# Patient Record
Sex: Female | Born: 1935 | Race: White | Hispanic: No | Marital: Married | Smoking: Never smoker
Health system: Southern US, Community
[De-identification: ages and names within clinical notes are randomized; demographics above are authoritative.]

## PROBLEM LIST (undated history)

## (undated) DIAGNOSIS — E039 Hypothyroidism, unspecified: Secondary | ICD-10-CM

## (undated) DIAGNOSIS — F419 Anxiety disorder, unspecified: Secondary | ICD-10-CM

## (undated) DIAGNOSIS — E782 Mixed hyperlipidemia: Secondary | ICD-10-CM

## (undated) DIAGNOSIS — I10 Essential (primary) hypertension: Secondary | ICD-10-CM

## (undated) DIAGNOSIS — I639 Cerebral infarction, unspecified: Secondary | ICD-10-CM

## (undated) DIAGNOSIS — E119 Type 2 diabetes mellitus without complications: Secondary | ICD-10-CM

## (undated) HISTORY — DX: Mixed hyperlipidemia: E78.2

## (undated) HISTORY — DX: Type 2 diabetes mellitus without complications: E11.9

## (undated) HISTORY — PX: REPLACEMENT TOTAL KNEE: SUR1224

## (undated) HISTORY — DX: Cerebral infarction, unspecified: I63.9

## (undated) HISTORY — DX: Hypothyroidism, unspecified: E03.9

## (undated) HISTORY — DX: Anxiety disorder, unspecified: F41.9

## (undated) HISTORY — DX: Essential (primary) hypertension: I10

---

## 2015-08-09 DIAGNOSIS — I639 Cerebral infarction, unspecified: Secondary | ICD-10-CM | POA: Diagnosis not present

## 2015-08-09 DIAGNOSIS — E785 Hyperlipidemia, unspecified: Secondary | ICD-10-CM | POA: Diagnosis not present

## 2015-08-09 DIAGNOSIS — I10 Essential (primary) hypertension: Secondary | ICD-10-CM | POA: Diagnosis not present

## 2015-08-09 DIAGNOSIS — E119 Type 2 diabetes mellitus without complications: Secondary | ICD-10-CM | POA: Diagnosis not present

## 2015-08-09 DIAGNOSIS — Z79899 Other long term (current) drug therapy: Secondary | ICD-10-CM | POA: Diagnosis not present

## 2015-08-09 DIAGNOSIS — E039 Hypothyroidism, unspecified: Secondary | ICD-10-CM | POA: Diagnosis not present

## 2015-08-09 DIAGNOSIS — R5383 Other fatigue: Secondary | ICD-10-CM | POA: Diagnosis not present

## 2015-08-23 DIAGNOSIS — N390 Urinary tract infection, site not specified: Secondary | ICD-10-CM | POA: Diagnosis not present

## 2015-09-15 DIAGNOSIS — S92301A Fracture of unspecified metatarsal bone(s), right foot, initial encounter for closed fracture: Secondary | ICD-10-CM | POA: Diagnosis not present

## 2015-09-15 DIAGNOSIS — M7740 Metatarsalgia, unspecified foot: Secondary | ICD-10-CM | POA: Diagnosis not present

## 2015-09-29 DIAGNOSIS — I639 Cerebral infarction, unspecified: Secondary | ICD-10-CM | POA: Diagnosis not present

## 2015-10-02 DIAGNOSIS — M7740 Metatarsalgia, unspecified foot: Secondary | ICD-10-CM | POA: Diagnosis not present

## 2015-10-07 DIAGNOSIS — I639 Cerebral infarction, unspecified: Secondary | ICD-10-CM | POA: Diagnosis not present

## 2015-10-14 DIAGNOSIS — I639 Cerebral infarction, unspecified: Secondary | ICD-10-CM | POA: Diagnosis not present

## 2015-10-21 DIAGNOSIS — I639 Cerebral infarction, unspecified: Secondary | ICD-10-CM | POA: Diagnosis not present

## 2015-10-23 DIAGNOSIS — I638 Other cerebral infarction: Secondary | ICD-10-CM | POA: Diagnosis not present

## 2015-10-23 DIAGNOSIS — Z79899 Other long term (current) drug therapy: Secondary | ICD-10-CM | POA: Diagnosis not present

## 2015-11-07 DIAGNOSIS — I639 Cerebral infarction, unspecified: Secondary | ICD-10-CM | POA: Diagnosis not present

## 2015-11-09 DIAGNOSIS — Z471 Aftercare following joint replacement surgery: Secondary | ICD-10-CM | POA: Diagnosis not present

## 2015-11-09 DIAGNOSIS — T84092A Other mechanical complication of internal right knee prosthesis, initial encounter: Secondary | ICD-10-CM | POA: Diagnosis not present

## 2015-11-09 DIAGNOSIS — Z96651 Presence of right artificial knee joint: Secondary | ICD-10-CM | POA: Diagnosis not present

## 2015-11-11 DIAGNOSIS — I10 Essential (primary) hypertension: Secondary | ICD-10-CM | POA: Diagnosis not present

## 2015-11-11 DIAGNOSIS — E785 Hyperlipidemia, unspecified: Secondary | ICD-10-CM | POA: Diagnosis not present

## 2015-11-11 DIAGNOSIS — E119 Type 2 diabetes mellitus without complications: Secondary | ICD-10-CM | POA: Diagnosis not present

## 2015-11-11 DIAGNOSIS — I639 Cerebral infarction, unspecified: Secondary | ICD-10-CM | POA: Diagnosis not present

## 2015-11-11 DIAGNOSIS — E039 Hypothyroidism, unspecified: Secondary | ICD-10-CM | POA: Diagnosis not present

## 2015-11-20 DIAGNOSIS — E119 Type 2 diabetes mellitus without complications: Secondary | ICD-10-CM | POA: Diagnosis not present

## 2015-11-25 DIAGNOSIS — I639 Cerebral infarction, unspecified: Secondary | ICD-10-CM | POA: Diagnosis not present

## 2015-11-27 DIAGNOSIS — R55 Syncope and collapse: Secondary | ICD-10-CM | POA: Diagnosis not present

## 2015-11-27 DIAGNOSIS — E119 Type 2 diabetes mellitus without complications: Secondary | ICD-10-CM | POA: Diagnosis not present

## 2015-11-27 DIAGNOSIS — K5732 Diverticulitis of large intestine without perforation or abscess without bleeding: Secondary | ICD-10-CM | POA: Diagnosis not present

## 2015-11-27 DIAGNOSIS — I10 Essential (primary) hypertension: Secondary | ICD-10-CM | POA: Diagnosis not present

## 2015-11-27 DIAGNOSIS — N390 Urinary tract infection, site not specified: Secondary | ICD-10-CM | POA: Diagnosis not present

## 2015-11-27 DIAGNOSIS — R112 Nausea with vomiting, unspecified: Secondary | ICD-10-CM | POA: Diagnosis not present

## 2015-11-27 DIAGNOSIS — R42 Dizziness and giddiness: Secondary | ICD-10-CM | POA: Diagnosis not present

## 2015-11-27 DIAGNOSIS — Z7984 Long term (current) use of oral hypoglycemic drugs: Secondary | ICD-10-CM | POA: Diagnosis not present

## 2015-11-29 DIAGNOSIS — N390 Urinary tract infection, site not specified: Secondary | ICD-10-CM | POA: Diagnosis not present

## 2015-11-29 DIAGNOSIS — K5732 Diverticulitis of large intestine without perforation or abscess without bleeding: Secondary | ICD-10-CM | POA: Diagnosis not present

## 2015-11-29 DIAGNOSIS — R6881 Early satiety: Secondary | ICD-10-CM | POA: Diagnosis not present

## 2015-11-29 DIAGNOSIS — R1033 Periumbilical pain: Secondary | ICD-10-CM | POA: Diagnosis not present

## 2015-11-29 DIAGNOSIS — R112 Nausea with vomiting, unspecified: Secondary | ICD-10-CM | POA: Diagnosis not present

## 2015-11-29 DIAGNOSIS — R1013 Epigastric pain: Secondary | ICD-10-CM | POA: Diagnosis not present

## 2015-11-29 DIAGNOSIS — E119 Type 2 diabetes mellitus without complications: Secondary | ICD-10-CM | POA: Diagnosis not present

## 2015-11-29 DIAGNOSIS — R109 Unspecified abdominal pain: Secondary | ICD-10-CM | POA: Diagnosis not present

## 2015-11-29 DIAGNOSIS — E782 Mixed hyperlipidemia: Secondary | ICD-10-CM | POA: Diagnosis not present

## 2015-12-19 DIAGNOSIS — K5792 Diverticulitis of intestine, part unspecified, without perforation or abscess without bleeding: Secondary | ICD-10-CM | POA: Diagnosis not present

## 2015-12-23 DIAGNOSIS — I639 Cerebral infarction, unspecified: Secondary | ICD-10-CM | POA: Diagnosis not present

## 2016-01-04 DIAGNOSIS — I639 Cerebral infarction, unspecified: Secondary | ICD-10-CM | POA: Diagnosis not present

## 2016-01-11 DIAGNOSIS — Z7901 Long term (current) use of anticoagulants: Secondary | ICD-10-CM | POA: Diagnosis not present

## 2016-01-18 DIAGNOSIS — I639 Cerebral infarction, unspecified: Secondary | ICD-10-CM | POA: Diagnosis not present

## 2016-01-23 DIAGNOSIS — K5792 Diverticulitis of intestine, part unspecified, without perforation or abscess without bleeding: Secondary | ICD-10-CM | POA: Diagnosis not present

## 2016-01-26 DIAGNOSIS — M79673 Pain in unspecified foot: Secondary | ICD-10-CM | POA: Diagnosis not present

## 2016-01-26 DIAGNOSIS — Z7901 Long term (current) use of anticoagulants: Secondary | ICD-10-CM | POA: Diagnosis not present

## 2016-01-26 DIAGNOSIS — B001 Herpesviral vesicular dermatitis: Secondary | ICD-10-CM | POA: Diagnosis not present

## 2016-02-06 DIAGNOSIS — I639 Cerebral infarction, unspecified: Secondary | ICD-10-CM | POA: Diagnosis not present

## 2016-02-28 DIAGNOSIS — Z7901 Long term (current) use of anticoagulants: Secondary | ICD-10-CM | POA: Diagnosis not present

## 2016-03-01 DIAGNOSIS — Z961 Presence of intraocular lens: Secondary | ICD-10-CM | POA: Diagnosis not present

## 2016-03-01 DIAGNOSIS — E119 Type 2 diabetes mellitus without complications: Secondary | ICD-10-CM | POA: Diagnosis not present

## 2016-03-01 DIAGNOSIS — H04123 Dry eye syndrome of bilateral lacrimal glands: Secondary | ICD-10-CM | POA: Diagnosis not present

## 2016-03-12 DIAGNOSIS — E785 Hyperlipidemia, unspecified: Secondary | ICD-10-CM | POA: Diagnosis not present

## 2016-03-12 DIAGNOSIS — E119 Type 2 diabetes mellitus without complications: Secondary | ICD-10-CM | POA: Diagnosis not present

## 2016-03-12 DIAGNOSIS — R5383 Other fatigue: Secondary | ICD-10-CM | POA: Diagnosis not present

## 2016-03-12 DIAGNOSIS — I10 Essential (primary) hypertension: Secondary | ICD-10-CM | POA: Diagnosis not present

## 2016-03-20 DIAGNOSIS — L600 Ingrowing nail: Secondary | ICD-10-CM | POA: Diagnosis not present

## 2016-03-20 DIAGNOSIS — E1151 Type 2 diabetes mellitus with diabetic peripheral angiopathy without gangrene: Secondary | ICD-10-CM | POA: Diagnosis not present

## 2016-03-22 DIAGNOSIS — E782 Mixed hyperlipidemia: Secondary | ICD-10-CM | POA: Diagnosis not present

## 2016-03-22 DIAGNOSIS — E785 Hyperlipidemia, unspecified: Secondary | ICD-10-CM | POA: Diagnosis not present

## 2016-03-22 DIAGNOSIS — E119 Type 2 diabetes mellitus without complications: Secondary | ICD-10-CM | POA: Diagnosis not present

## 2016-04-17 DIAGNOSIS — Z79899 Other long term (current) drug therapy: Secondary | ICD-10-CM | POA: Diagnosis not present

## 2016-04-17 DIAGNOSIS — I639 Cerebral infarction, unspecified: Secondary | ICD-10-CM | POA: Diagnosis not present

## 2016-04-22 DIAGNOSIS — M1991 Primary osteoarthritis, unspecified site: Secondary | ICD-10-CM | POA: Diagnosis not present

## 2016-04-22 DIAGNOSIS — E118 Type 2 diabetes mellitus with unspecified complications: Secondary | ICD-10-CM | POA: Diagnosis not present

## 2016-04-22 DIAGNOSIS — I2609 Other pulmonary embolism with acute cor pulmonale: Secondary | ICD-10-CM | POA: Diagnosis not present

## 2016-04-24 DIAGNOSIS — I6340 Cerebral infarction due to embolism of unspecified cerebral artery: Secondary | ICD-10-CM | POA: Diagnosis not present

## 2016-05-01 DIAGNOSIS — Z79899 Other long term (current) drug therapy: Secondary | ICD-10-CM | POA: Diagnosis not present

## 2016-05-01 DIAGNOSIS — I639 Cerebral infarction, unspecified: Secondary | ICD-10-CM | POA: Diagnosis not present

## 2016-05-01 DIAGNOSIS — F329 Major depressive disorder, single episode, unspecified: Secondary | ICD-10-CM | POA: Diagnosis not present

## 2016-05-01 DIAGNOSIS — F419 Anxiety disorder, unspecified: Secondary | ICD-10-CM | POA: Diagnosis not present

## 2016-05-15 DIAGNOSIS — I6340 Cerebral infarction due to embolism of unspecified cerebral artery: Secondary | ICD-10-CM | POA: Diagnosis not present

## 2016-05-21 DIAGNOSIS — F411 Generalized anxiety disorder: Secondary | ICD-10-CM | POA: Diagnosis not present

## 2016-05-21 DIAGNOSIS — F332 Major depressive disorder, recurrent severe without psychotic features: Secondary | ICD-10-CM | POA: Diagnosis not present

## 2016-05-28 DIAGNOSIS — Z7901 Long term (current) use of anticoagulants: Secondary | ICD-10-CM | POA: Diagnosis not present

## 2016-06-07 DIAGNOSIS — Z7901 Long term (current) use of anticoagulants: Secondary | ICD-10-CM | POA: Diagnosis not present

## 2016-06-12 DIAGNOSIS — I10 Essential (primary) hypertension: Secondary | ICD-10-CM | POA: Diagnosis not present

## 2016-06-12 DIAGNOSIS — E119 Type 2 diabetes mellitus without complications: Secondary | ICD-10-CM | POA: Diagnosis not present

## 2016-06-12 DIAGNOSIS — E785 Hyperlipidemia, unspecified: Secondary | ICD-10-CM | POA: Diagnosis not present

## 2016-06-12 DIAGNOSIS — R5383 Other fatigue: Secondary | ICD-10-CM | POA: Diagnosis not present

## 2016-06-12 DIAGNOSIS — E782 Mixed hyperlipidemia: Secondary | ICD-10-CM | POA: Diagnosis not present

## 2016-06-12 DIAGNOSIS — E039 Hypothyroidism, unspecified: Secondary | ICD-10-CM | POA: Diagnosis not present

## 2016-06-24 DIAGNOSIS — Z6839 Body mass index (BMI) 39.0-39.9, adult: Secondary | ICD-10-CM | POA: Diagnosis not present

## 2016-06-24 DIAGNOSIS — Z0000 Encounter for general adult medical examination without abnormal findings: Secondary | ICD-10-CM | POA: Diagnosis not present

## 2016-07-24 DIAGNOSIS — E119 Type 2 diabetes mellitus without complications: Secondary | ICD-10-CM | POA: Diagnosis not present

## 2016-07-24 DIAGNOSIS — N390 Urinary tract infection, site not specified: Secondary | ICD-10-CM | POA: Diagnosis not present

## 2016-07-24 DIAGNOSIS — E785 Hyperlipidemia, unspecified: Secondary | ICD-10-CM | POA: Diagnosis not present

## 2016-07-24 DIAGNOSIS — I10 Essential (primary) hypertension: Secondary | ICD-10-CM | POA: Diagnosis not present

## 2016-07-31 DIAGNOSIS — E119 Type 2 diabetes mellitus without complications: Secondary | ICD-10-CM | POA: Diagnosis not present

## 2016-07-31 DIAGNOSIS — E039 Hypothyroidism, unspecified: Secondary | ICD-10-CM | POA: Diagnosis not present

## 2016-07-31 DIAGNOSIS — I10 Essential (primary) hypertension: Secondary | ICD-10-CM | POA: Diagnosis not present

## 2016-07-31 DIAGNOSIS — F329 Major depressive disorder, single episode, unspecified: Secondary | ICD-10-CM | POA: Diagnosis not present

## 2016-08-30 DIAGNOSIS — Z7901 Long term (current) use of anticoagulants: Secondary | ICD-10-CM | POA: Diagnosis not present

## 2016-09-26 DIAGNOSIS — Z7901 Long term (current) use of anticoagulants: Secondary | ICD-10-CM | POA: Diagnosis not present

## 2016-10-22 DIAGNOSIS — Z8679 Personal history of other diseases of the circulatory system: Secondary | ICD-10-CM | POA: Diagnosis not present

## 2016-10-22 DIAGNOSIS — I10 Essential (primary) hypertension: Secondary | ICD-10-CM | POA: Diagnosis not present

## 2016-10-22 DIAGNOSIS — F419 Anxiety disorder, unspecified: Secondary | ICD-10-CM | POA: Diagnosis not present

## 2016-10-22 DIAGNOSIS — Z79899 Other long term (current) drug therapy: Secondary | ICD-10-CM | POA: Diagnosis not present

## 2016-10-22 DIAGNOSIS — Z7901 Long term (current) use of anticoagulants: Secondary | ICD-10-CM | POA: Diagnosis not present

## 2016-10-22 DIAGNOSIS — E039 Hypothyroidism, unspecified: Secondary | ICD-10-CM | POA: Diagnosis not present

## 2016-10-22 DIAGNOSIS — E119 Type 2 diabetes mellitus without complications: Secondary | ICD-10-CM | POA: Diagnosis not present

## 2016-11-06 DIAGNOSIS — Z7901 Long term (current) use of anticoagulants: Secondary | ICD-10-CM | POA: Diagnosis not present

## 2016-11-07 ENCOUNTER — Encounter: Payer: Self-pay | Admitting: Podiatry

## 2016-11-07 ENCOUNTER — Ambulatory Visit (INDEPENDENT_AMBULATORY_CARE_PROVIDER_SITE_OTHER): Payer: Medicare Other

## 2016-11-07 ENCOUNTER — Ambulatory Visit (INDEPENDENT_AMBULATORY_CARE_PROVIDER_SITE_OTHER): Payer: Medicare Other | Admitting: Podiatry

## 2016-11-07 DIAGNOSIS — M2041 Other hammer toe(s) (acquired), right foot: Secondary | ICD-10-CM | POA: Diagnosis not present

## 2016-11-07 DIAGNOSIS — E1142 Type 2 diabetes mellitus with diabetic polyneuropathy: Secondary | ICD-10-CM

## 2016-11-07 DIAGNOSIS — M2042 Other hammer toe(s) (acquired), left foot: Secondary | ICD-10-CM | POA: Diagnosis not present

## 2016-11-07 NOTE — Progress Notes (Signed)
   Subjective:    Patient ID: Valerie Washington, female    DOB: September 26, 1934, 82 y.o.   MRN: [id number]  HPI: She presents with her husband today with a history of non-insulin-dependent diabetes mellitus. She states that her blood sugars were running high and she needs to keep a check on her feet. She has a history of stroke as well. She denies any trauma or neuropathy.    Review of Systems  Constitutional: Positive for fatigue.  HENT: Positive for sinus pressure and sore throat.   Eyes: Positive for itching.  Respiratory: Positive for shortness of breath.   Genitourinary: Positive for frequency.  Musculoskeletal: Positive for arthralgias, gait problem and myalgias.  Neurological: Positive for dizziness and numbness.  Hematological: Bruises/bleeds easily.  Psychiatric/Behavioral: Positive for confusion.  All other systems reviewed and are negative.      Objective:   Physical Exam: Vital signs are stable alert and oriented 3. Pulses are palpable. Neurologic sensorium is diminished per vibratory sensation. Semmes-Weinstein monofilament is intact. Deep tendon reflexes are intact and brisk and equal bilateral. Muscle strength dorsiflexors plantar flexors and inverters everters is all intact. Orthopedic evaluation demonstrates all joints distal to the ankle for range of motion without crepitus mild flexible hammertoe deformities are noted. Appears that she has 2 scars overlying the third interdigital space bilaterally consistent with neurectomy. Radiographs taken today do not demonstrate any type of major osseous abnormalities other than osteopenia and some osteoarthritic changes.       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 Diabetic peripheral neuropathy. Hammertoe deformities bilateral.  Plan: Discussed the pros and cons of diabetes and foot care. Also discussed diabetic peripheral neuropathy in great detail. Discussed the possible need for diabetic shoes in the  future. She is to carefully watch her feet every single day and wash them and apply lotion to her feet. I will follow-up with her in 6 months should she have questions or concerns she will notify us immediately.

## 2016-11-15 ENCOUNTER — Telehealth: Payer: Self-pay

## 2016-11-15 NOTE — Telephone Encounter (Signed)
Sent notes to scheduling 

## 2016-11-19 ENCOUNTER — Ambulatory Visit (INDEPENDENT_AMBULATORY_CARE_PROVIDER_SITE_OTHER): Payer: Medicare Other | Admitting: Endocrinology

## 2016-11-19 ENCOUNTER — Encounter: Payer: Self-pay | Admitting: Endocrinology

## 2016-11-19 DIAGNOSIS — I10 Essential (primary) hypertension: Secondary | ICD-10-CM | POA: Diagnosis not present

## 2016-11-19 DIAGNOSIS — E039 Hypothyroidism, unspecified: Secondary | ICD-10-CM | POA: Diagnosis not present

## 2016-11-19 DIAGNOSIS — E1151 Type 2 diabetes mellitus with diabetic peripheral angiopathy without gangrene: Secondary | ICD-10-CM | POA: Diagnosis not present

## 2016-11-19 DIAGNOSIS — F419 Anxiety disorder, unspecified: Secondary | ICD-10-CM | POA: Diagnosis not present

## 2016-11-19 DIAGNOSIS — I639 Cerebral infarction, unspecified: Secondary | ICD-10-CM

## 2016-11-19 MED ORDER — CANAGLIFLOZIN 100 MG PO TABS: 100.0000 mg | ORAL_TABLET | Freq: Every day | ORAL | 11 refills | Status: DC

## 2016-11-19 MED ORDER — METFORMIN HCL ER 500 MG PO TB24: 500.0000 mg | ORAL_TABLET | Freq: Every day | ORAL | 11 refills | Status: DC

## 2016-11-19 NOTE — Progress Notes (Signed)
Subjective:    Patient ID: Valerie Washington, female    DOB: 1934-09-28, 82 y.o.   MRN: [id number]  HPI pt is referred by Dr Abigail Miyamoto, for diabetes.  Pt states DM was dx'ed in 2007; she has mild neuropathy of the lower extremities, and associated CVA; she has never been on insulin; pt says her diet is good, but exercise is good; she has never had GDM, pancreatitis, pancreatic surgery, severe hypoglycemia or DKA.  She takes invokana.  She did not tolerate metformin (nausea).   Past Medical History:  Diagnosis Date  . Anxiety   . HTN (hypertension)   . Hypothyroidism     No past surgical history on file.  Social History   Social History  . Marital status: Married    Spouse name: N/A  . Number of children: N/A  . Years of education: N/A   Occupational History  . Not on file.   Social History Main Topics  . Smoking status: Never Smoker  . Smokeless tobacco: Never Used  . Alcohol use Not on file  . Drug use: Unknown  . Sexual activity: Not on file   Other Topics Concern  . Not on file   Social History Narrative  . No narrative on file    Current Outpatient Prescriptions on File Prior to Visit  Medication Sig Dispense Refill  . atenolol (TENORMIN) 25 MG tablet Take by mouth daily.    . Levothyroxine Sodium (LEVOTHROID PO) Take by mouth.    . warfarin (COUMADIN) 3 MG tablet Take 3 mg by mouth daily.     No current facility-administered medications on file prior to visit.     Allergies  Allergen Reactions  . Codeine     Family History  Problem Relation Age of Onset  . Diabetes Father     BP 126/80   Pulse 98   Ht 5\' 3"  (1.6 m)   Wt 171 lb (77.6 kg)   SpO2 95%   BMI 30.29 kg/m    Review of Systems denies headache, chest pain, n/v, muscle cramps, and cold intolerance.  She has memory loss since cva.  She has chronic visual loss.  She has lost 20 lbs x a few months.  She has doe, dry skin, rhinorrhea, depression, easy bruising, and frequent urination.       Objective:   Physical Exam VS: see vs page GEN: no distress HEAD: head: no deformity eyes: no periorbital swelling, no proptosis external nose and ears are normal mouth: no lesion seen NECK: supple, thyroid is not enlarged CHEST WALL: no deformity LUNGS: clear to auscultation CV: reg rate and rhythm, no murmur ABD: abdomen is soft, nontender.  no hepatosplenomegaly.  not distended.  no hernia MUSCULOSKELETAL: muscle bulk and strength are grossly normal.  no obvious joint swelling.  gait is normal and steady EXTEMITIES: no deformity.  no ulcer on the feet.  feet are of normal color and temp.  Trace bilat leg edema, and bilat vv's.  There is bilateral onychomycosis of the toenails.  Old healed surgical scars on both knees.  PULSES: dorsalis pedis intact bilat.  no carotid bruit NEURO:  cn 2-12 grossly intact.   readily moves all 4's.  sensation is intact to touch on the feet SKIN:  Normal texture and temperature.  No rash or suspicious lesion is visible.   NODES:  None palpable at the neck PSYCH: alert, well-oriented.  Does not appear anxious nor depressed.  outside test results are reviewed: A1c=9.1%  I  have reviewed outside records, and summarized: Pt was noted to have elevated a1c, and referred here.  She was noted to be on chronic coumadin.  She has memory loss.       Assessment & Plan:  Type 2 DM, with CVA: she needs increased rx.  She is hesitant to add another oral med, or to take insulin.  We discussed.  She agrees to add low-dose metformin-XR Edema, new to me: this limits rx options Nausea: in this context, we'll start with just 500/d of metformin, and make it the -XR type.   Patient Instructions  good diet and exercise significantly improve the control of your diabetes.  please let me know if you wish to be referred to a dietician.  high blood sugar is very risky to your health.  you should see an eye doctor and dentist every year.  It is very important to get all  recommended vaccinations.  Controlling your blood pressure and cholesterol drastically reduces the damage diabetes does to your body.  Those who smoke should quit.  Please discuss these with your doctor.  check your blood sugar once a day.  vary the time of day when you check, between before the 3 meals, and at bedtime.  also check if you have symptoms of your blood sugar being too high or too low.  please keep a record of the readings and bring it to your next appointment here (or you can bring the meter itself).  You can write it on any piece of paper.  please call us sooner if your blood sugar goes below 70, or if you have a lot of readings over 200. It is ok to take invokana, 1 per day. I have sent a prescription to your pharmacy, to add extended-release metformin, 1 per day.  Please come back for a follow-up appointment in 2-3 weeks.

## 2016-11-19 NOTE — Patient Instructions (Addendum)
good diet and exercise significantly improve the control of your diabetes.  please let me know if you wish to be referred to a dietician.  high blood sugar is very risky to your health.  you should see an eye doctor and dentist every year.  It is very important to get all recommended vaccinations.  Controlling your blood pressure and cholesterol drastically reduces the damage diabetes does to your body.  Those who smoke should quit.  Please discuss these with your doctor.  check your blood sugar once a day.  vary the time of day when you check, between before the 3 meals, and at bedtime.  also check if you have symptoms of your blood sugar being too high or too low.  please keep a record of the readings and bring it to your next appointment here (or you can bring the meter itself).  You can write it on any piece of paper.  please call us sooner if your blood sugar goes below 70, or if you have a lot of readings over 200. It is ok to take invokana, 1 per day. I have sent a prescription to your pharmacy, to add extended-release metformin, 1 per day.  Please come back for a follow-up appointment in 2-3 weeks.

## 2016-11-20 ENCOUNTER — Encounter: Payer: Self-pay | Admitting: Endocrinology

## 2016-11-20 DIAGNOSIS — E119 Type 2 diabetes mellitus without complications: Secondary | ICD-10-CM

## 2016-11-20 DIAGNOSIS — I10 Essential (primary) hypertension: Secondary | ICD-10-CM | POA: Insufficient documentation

## 2016-11-20 DIAGNOSIS — I639 Cerebral infarction, unspecified: Secondary | ICD-10-CM

## 2016-11-20 DIAGNOSIS — E039 Hypothyroidism, unspecified: Secondary | ICD-10-CM | POA: Insufficient documentation

## 2016-11-20 DIAGNOSIS — F419 Anxiety disorder, unspecified: Secondary | ICD-10-CM | POA: Insufficient documentation

## 2016-11-20 HISTORY — DX: Cerebral infarction, unspecified: I63.9

## 2016-11-20 HISTORY — DX: Type 2 diabetes mellitus without complications: E11.9

## 2016-11-26 ENCOUNTER — Telehealth: Payer: Self-pay | Admitting: Endocrinology

## 2016-11-26 DIAGNOSIS — Z7901 Long term (current) use of anticoagulants: Secondary | ICD-10-CM | POA: Diagnosis not present

## 2016-11-26 NOTE — Telephone Encounter (Signed)
D/c metformin If you have passing out, go to ER

## 2016-11-26 NOTE — Telephone Encounter (Signed)
I contacted the patient's husband and advised of message. He voiced understanding and had no further questions at this time

## 2016-11-26 NOTE — Telephone Encounter (Signed)
Patient has been fainting since she has been back on the metformin and thinking it could be causing this. please advise,

## 2016-12-05 ENCOUNTER — Ambulatory Visit (INDEPENDENT_AMBULATORY_CARE_PROVIDER_SITE_OTHER): Payer: Medicare Other | Admitting: Endocrinology

## 2016-12-05 ENCOUNTER — Encounter: Payer: Self-pay | Admitting: Endocrinology

## 2016-12-05 VITALS — BP 120/76 | HR 95 | Ht 63.0 in | Wt 170.0 lb

## 2016-12-05 DIAGNOSIS — I639 Cerebral infarction, unspecified: Secondary | ICD-10-CM | POA: Diagnosis not present

## 2016-12-05 DIAGNOSIS — E1151 Type 2 diabetes mellitus with diabetic peripheral angiopathy without gangrene: Secondary | ICD-10-CM | POA: Diagnosis not present

## 2016-12-05 MED ORDER — SITAGLIPTIN PHOSPHATE 100 MG PO TABS: 100.0000 mg | ORAL_TABLET | Freq: Every day | ORAL | 3 refills | Status: DC

## 2016-12-05 NOTE — Progress Notes (Signed)
Subjective:    Patient ID: Valerie Washington, female    DOB: 1934-09-13, 82 y.o.   MRN: [id number]  HPI Pt returns for f/u of diabetes mellitus: DM type:  Dx'ed: 2007 Complications: polyneuropathy and CVA Therapy: invokana GDM: never DKA: never Severe hypoglycemia: never Pancreatitis: never Pancreatic imaging:  Other: she has never been on insulin; metformin dosage is limited by nausea; edema limits rx options. Interval history: She had syncope, with head contusion.  She attributes to metformin, but cbg was approx 200 during the episode.  Since off it, dizziness is resolved.  no cbg record, but states cbg's vary from 125-212.   Past Medical History:  Diagnosis Date  . Anxiety   . CVA (cerebral vascular accident) (HCC) 11/20/2016  . Diabetes (HCC) 11/20/2016  . HTN (hypertension)   . Hyperlipemia, mixed   . Hypothyroidism     Past Surgical History:  Procedure Laterality Date  . REPLACEMENT TOTAL KNEE      Social History   Social History  . Marital status: Married    Spouse name: N/A  . Number of children: N/A  . Years of education: N/A   Occupational History  . Not on file.   Social History Main Topics  . Smoking status: Never Smoker  . Smokeless tobacco: Never Used  . Alcohol use No  . Drug use: No  . Sexual activity: Not on file     Comment: MARRIED   Other Topics Concern  . Not on file   Social History Narrative  . No narrative on file    Current Outpatient Prescriptions on File Prior to Visit  Medication Sig Dispense Refill  . atenolol (TENORMIN) 25 MG tablet Take by mouth daily.    . canagliflozin (INVOKANA) 100 MG TABS tablet Take 1 tablet (100 mg total) by mouth daily before breakfast. (Patient taking differently: Take 100 mg by mouth daily before breakfast. ) 30 tablet 11  . Levothyroxine Sodium (LEVOTHROID PO) Take 75 mcg by mouth.     . sertraline (ZOLOFT) 100 MG tablet Take 100 mg by mouth daily.    Marland Kitchen warfarin (COUMADIN) 3 MG tablet Take 4 mg by  mouth daily.      No current facility-administered medications on file prior to visit.     Allergies  Allergen Reactions  . Codeine     Family History  Problem Relation Age of Onset  . Diabetes Father   . CVA Father   . Canavan disease Mother   . Heart disease Other        7 SIBLINGS DIED FROM HEART DISEASE    BP 120/76   Pulse 95   Ht 5\' 3"  (1.6 m)   Wt 170 lb (77.1 kg)   SpO2 96%   BMI 30.11 kg/m    Review of Systems She denies hypoglycemia.      Objective:   Physical Exam VITAL SIGNS:  See vs page GENERAL: no distress Pulses: dorsalis pedis intact bilat.   MSK: no deformity of the feet CV: 1+ bilat leg edema, and bilat vv's.   Skin:  no ulcer on the feet.  normal color and temp on the feet.   Neuro: sensation is intact to touch on the feet, but decreased from normal.       Assessment & Plan:  Syncope, new to me: not related to metformin, but she requests to stay off it. Type 2 DM, with CVA: she needs increased rx.  Patient Instructions  check your blood sugar once  a day.  vary the time of day when you check, between before the 3 meals, and at bedtime.  also check if you have symptoms of your blood sugar being too high or too low.  please keep a record of the readings and bring it to your next appointment here (or you can bring the meter itself).  You can write it on any piece of paper.  please call us sooner if your blood sugar goes below 70, or if you have a lot of readings over 200. Please continue the same invokana, and:  I have sent a prescription to your pharmacy, to add "januvia."   Please come back for a follow-up appointment in 6 weeks.

## 2016-12-05 NOTE — Patient Instructions (Addendum)
check your blood sugar once a day.  vary the time of day when you check, between before the 3 meals, and at bedtime.  also check if you have symptoms of your blood sugar being too high or too low.  please keep a record of the readings and bring it to your next appointment here (or you can bring the meter itself).  You can write it on any piece of paper.  please call us sooner if your blood sugar goes below 70, or if you have a lot of readings over 200. Please continue the same invokana, and:  I have sent a prescription to your pharmacy, to add "januvia."   Please come back for a follow-up appointment in 6 weeks.

## 2016-12-11 DIAGNOSIS — Z7901 Long term (current) use of anticoagulants: Secondary | ICD-10-CM | POA: Diagnosis not present

## 2016-12-12 ENCOUNTER — Other Ambulatory Visit: Payer: Self-pay

## 2016-12-17 ENCOUNTER — Ambulatory Visit (INDEPENDENT_AMBULATORY_CARE_PROVIDER_SITE_OTHER): Payer: Medicare Other | Admitting: Interventional Cardiology

## 2016-12-17 ENCOUNTER — Encounter: Payer: Self-pay | Admitting: Interventional Cardiology

## 2016-12-17 ENCOUNTER — Encounter (INDEPENDENT_AMBULATORY_CARE_PROVIDER_SITE_OTHER): Payer: Self-pay

## 2016-12-17 VITALS — BP 142/82 | HR 101 | Ht 61.5 in | Wt 170.4 lb

## 2016-12-17 DIAGNOSIS — I10 Essential (primary) hypertension: Secondary | ICD-10-CM | POA: Diagnosis not present

## 2016-12-17 DIAGNOSIS — I639 Cerebral infarction, unspecified: Secondary | ICD-10-CM

## 2016-12-17 DIAGNOSIS — Z7901 Long term (current) use of anticoagulants: Secondary | ICD-10-CM | POA: Insufficient documentation

## 2016-12-17 DIAGNOSIS — E1151 Type 2 diabetes mellitus with diabetic peripheral angiopathy without gangrene: Secondary | ICD-10-CM | POA: Diagnosis not present

## 2016-12-17 NOTE — Progress Notes (Signed)
Cardiology Office Note   Date:  12/17/2016   ID:  Sandford Craze, DOB Oct 23, 1934, MRN [id number]  PCP:  Henrine Screws, MD    No chief complaint on file. prior CVA   Wt Readings from Last 3 Encounters:  12/17/16 170 lb 6.4 oz (77.3 kg)  12/05/16 170 lb (77.1 kg)  11/19/16 171 lb (77.6 kg)       History of Present Illness: Valerie Washington is a 82 y.o. female  Who had a stroke in 1997,, and was put on Coumadin.  She had another stroke in May 2017 while on Coumadin.  Memory loss was the main sx and behavioral changes were the main sx.   She recently moved to [place] from [facility].    Denies : Chest pain. Leg edema. Nitroglycerin use. Orthopnea. Palpitations. Paroxysmal nocturnal dyspnea.   She walks regularly.  She has decreased this lately, bit they try to do this most mornings.   She has had some lightheadedness and one episode of syncope.  She took some thyroid medicine and on the way back to the bed, she fell and blacked out shortly a couple of weeks ago.  Her husband came to her.  She had some mild bruising on her legs.  This occurred in the setting of changing her DM meds.  No further episodes since that time.  DM meds were changed.  Sx have improved with therapy and antidepressant.     Past Medical History:  Diagnosis Date  . Anxiety   . CVA (cerebral vascular accident) (HCC) 11/20/2016  . Diabetes (HCC) 11/20/2016  . HTN (hypertension)   . Hyperlipemia, mixed   . Hypothyroidism     Past Surgical History:  Procedure Laterality Date  . REPLACEMENT TOTAL KNEE       Current Outpatient Prescriptions  Medication Sig Dispense Refill  . canagliflozin (INVOKANA) 100 MG TABS tablet Take 200 mg by mouth as directed. Take 2 tabs daily before meal    . cycloSPORINE (RESTASIS) 0.05 % ophthalmic emulsion 1 drop as needed (Use as directed in affected eye).    . fluticasone (FLONASE) 50 MCG/ACT nasal spray Place 1 spray into both nostrils daily as needed for allergies  or rhinitis.    . Levothyroxine Sodium (LEVOTHROID PO) Take 75 mcg by mouth daily.     . metoprolol tartrate (LOPRESSOR) 50 MG tablet Take 25 mg by mouth 2 (two) times daily.    . sertraline (ZOLOFT) 100 MG tablet Take 100 mg by mouth daily.    . sitaGLIPtin (JANUVIA) 100 MG tablet Take 100 mg by mouth daily.     Marland Kitchen warfarin (COUMADIN) 3 MG tablet Take 3 mg by mouth daily.      No current facility-administered medications for this visit.     Allergies:   Codeine and Metformin and related    Social History:  The patient  reports that she has never smoked. She has never used smokeless tobacco. She reports that she does not drink alcohol or use drugs.   Family History:  The patient's family history includes CVA in her father; Canavan disease in her mother; Colon cancer in her mother; Diabetes in her father; Heart disease in her brother, other, and sister.    ROS:  Please see the history of present illness.   Otherwise, review of systems are positive for decreased balance at times.   All other systems are reviewed and negative.    PHYSICAL EXAM: VS:  BP (!) 142/82 (BP Location: Right  Arm)   Pulse (!) 101   Ht 5' 1.5" (1.562 m)   Wt 170 lb 6.4 oz (77.3 kg)   SpO2 95%   BMI 31.68 kg/m  , BMI Body mass index is 31.68 kg/m. GEN: Well nourished, well developed, in no acute distress  HEENT: normal  Neck: no JVD, carotid bruits, or masses Cardiac: RRR; no murmurs, rubs, or gallops,no edema  Respiratory:  clear to auscultation bilaterally, normal work of breathing GI: soft, nontender, nondistended, + BS MS: no deformity or atrophy  Skin: warm and dry, no rash Neuro:  Strength and sensation are intact Psych: euthymic mood, full affect   EKG:   The ekg ordered today demonstrates sinus tach 101 bpm; repeat HR 88   Recent Labs: No results found for requested labs within last 8760 hours.   Lipid Panel No results found for: CHOL, TRIG, HDL, CHOLHDL, VLDL, LDLCALC, LDLDIRECT   Other  studies Reviewed: Additional studies/ records that were reviewed today with results demonstrating: stress, echo carotid Doppler reviewed.   ASSESSMENT AND PLAN:  1. Prior CVA/anticoagulated: Unclear etiology.  Negative carotid DOpplers in 5/16.  She has been on Coumadin for many years.  No documented AFib.  Normal stress test in 5/16.  Echo in 5/16 showed EF 55%.   2. HTN: Could consider ACE-I if BP stays high.  She is reluctant to start any new meds. 3. DM: Followed by Dr. Everardo All.  Syncope may have been from low blood sugar. 4. Coumadin followed by Dr. Abigail Miyamoto.   Current medicines are reviewed at length with the patient today.  The patient concerns regarding her medicines were addressed.  The following changes have been made:  No change  Labs/ tests ordered today include:  No orders of the defined types were placed in this encounter.   Recommend 150 minutes/week of aerobic exercise Low fat, low carb, high fiber diet recommended  Disposition:   FU in 6 months, per her request   Signed, Lance Muss, MD  12/17/2016 10:32 AM    [facility] [address] [place], [place], [place]  [address] Phone: [phone]; Fax: [phone]

## 2016-12-17 NOTE — Patient Instructions (Signed)

## 2016-12-19 ENCOUNTER — Ambulatory Visit (INDEPENDENT_AMBULATORY_CARE_PROVIDER_SITE_OTHER): Payer: Medicare Other | Admitting: Podiatry

## 2016-12-19 ENCOUNTER — Encounter: Payer: Self-pay | Admitting: Podiatry

## 2016-12-19 DIAGNOSIS — I639 Cerebral infarction, unspecified: Secondary | ICD-10-CM

## 2016-12-19 DIAGNOSIS — E1142 Type 2 diabetes mellitus with diabetic polyneuropathy: Secondary | ICD-10-CM

## 2016-12-19 MED ORDER — GABAPENTIN 100 MG PO CAPS: 100.0000 mg | ORAL_CAPSULE | Freq: Every day | ORAL | 3 refills | Status: AC

## 2016-12-20 DIAGNOSIS — Z7901 Long term (current) use of anticoagulants: Secondary | ICD-10-CM | POA: Diagnosis not present

## 2016-12-22 NOTE — Progress Notes (Signed)
She presents today states that she has bilateral great toe pain and third toe pain right foot. I'm having pain in my feet and I don't know why.  Objective: Pulses are palpable. Neurologic sensorium is hard to differentiate it appears that she does have some sensory loss per Semmes-Weinstein monofilament. She also has rigid hammertoe deformities.  Assessment: Hammertoe deformities diabetic peripheral neuropathy.  Plan: Start her on gabapentin 100 mg at nighttime. Follow up with her in 1 month.

## 2017-01-03 DIAGNOSIS — Z7901 Long term (current) use of anticoagulants: Secondary | ICD-10-CM | POA: Diagnosis not present

## 2017-01-16 ENCOUNTER — Encounter: Payer: Self-pay | Admitting: Endocrinology

## 2017-01-16 ENCOUNTER — Ambulatory Visit (INDEPENDENT_AMBULATORY_CARE_PROVIDER_SITE_OTHER): Payer: Medicare Other | Admitting: Endocrinology

## 2017-01-16 VITALS — BP 122/82 | HR 85 | Ht 61.5 in | Wt 171.0 lb

## 2017-01-16 DIAGNOSIS — I639 Cerebral infarction, unspecified: Secondary | ICD-10-CM | POA: Diagnosis not present

## 2017-01-16 DIAGNOSIS — E1151 Type 2 diabetes mellitus with diabetic peripheral angiopathy without gangrene: Secondary | ICD-10-CM | POA: Diagnosis not present

## 2017-01-16 LAB — POCT GLYCOSYLATED HEMOGLOBIN (HGB A1C): HEMOGLOBIN A1C: 8.6

## 2017-01-16 MED ORDER — EMPAGLIFLOZIN 10 MG PO TABS: 10.0000 mg | ORAL_TABLET | Freq: Every day | ORAL | 11 refills | Status: DC

## 2017-01-16 NOTE — Patient Instructions (Addendum)
check your blood sugar once a day.  vary the time of day when you check, between before the 3 meals, and at bedtime.  also check if you have symptoms of your blood sugar being too high or too low.  please keep a record of the readings and bring it to your next appointment here (or you can bring the meter itself).  You can write it on any piece of paper.  please call us sooner if your blood sugar goes below 70, or if you have a lot of readings over 200. Please continue the same [place], and:  I have sent a prescription to your pharmacy, to add "jardiance."   Please come back for a follow-up appointment in 2 months.

## 2017-01-16 NOTE — Progress Notes (Signed)
Subjective:    Patient ID: Valerie Washington, female    DOB: Sep 19, 1934, 82 y.o.   MRN: [id number]  HPI Pt returns for f/u of diabetes mellitus: DM type: 2 Dx'ed: 2007 Complications: polyneuropathy and CVA Therapy: 2 oral meds GDM: never DKA: never Severe hypoglycemia: never Pancreatitis: never Pancreatic imaging: never.   Other: she has never been on insulin; she did not tolerate metformin (nausea and dizziness); edema also limits rx options.   Interval history: no cbg record, but states cbg's vary from 140-222.  There is no trend throughout the day. She stopped the invokana, due to media reports.   Past Medical History:  Diagnosis Date  . Anxiety   . CVA (cerebral vascular accident) (HCC) 11/20/2016  . Diabetes (HCC) 11/20/2016  . HTN (hypertension)   . Hyperlipemia, mixed   . Hypothyroidism     Past Surgical History:  Procedure Laterality Date  . REPLACEMENT TOTAL KNEE      Social History   Social History  . Marital status: Married    Spouse name: N/A  . Number of children: N/A  . Years of education: N/A   Occupational History  . Not on file.   Social History Main Topics  . Smoking status: Never Smoker  . Smokeless tobacco: Never Used  . Alcohol use No  . Drug use: No  . Sexual activity: Not on file     Comment: MARRIED   Other Topics Concern  . Not on file   Social History Narrative  . No narrative on file    Current Outpatient Prescriptions on File Prior to Visit  Medication Sig Dispense Refill  . cycloSPORINE (RESTASIS) 0.05 % ophthalmic emulsion 1 drop as needed (Use as directed in affected eye).    . fluticasone (FLONASE) 50 MCG/ACT nasal spray Place 1 spray into both nostrils daily as needed for allergies or rhinitis.    Marland Kitchen gabapentin (NEURONTIN) 100 MG capsule Take 1 capsule (100 mg total) by mouth at bedtime. 90 capsule 3  . Levothyroxine Sodium (LEVOTHROID PO) Take 75 mcg by mouth daily.     . metoprolol tartrate (LOPRESSOR) 50 MG tablet Take 25 mg  by mouth 2 (two) times daily.    . sertraline (ZOLOFT) 100 MG tablet Take 100 mg by mouth daily.    . sitaGLIPtin (JANUVIA) 100 MG tablet Take 100 mg by mouth daily.     Marland Kitchen warfarin (COUMADIN) 3 MG tablet Take 3 mg by mouth daily.      No current facility-administered medications on file prior to visit.     Allergies  Allergen Reactions  . Codeine Nausea Only  . Metformin And Related     Lack of therapeutic effect    Family History  Problem Relation Age of Onset  . Diabetes Father   . CVA Father   . Canavan disease Mother   . Colon cancer Mother   . Heart disease Other        7 SIBLINGS DIED FROM HEART DISEASE  . Heart disease Sister   . Heart disease Brother     BP 122/82   Pulse 85   Ht 5' 1.5" (1.562 m)   Wt 171 lb (77.6 kg)   SpO2 94%   BMI 31.79 kg/m    Review of Systems She denies hypoglycemia.      Objective:   Physical Exam VITAL SIGNS:  See vs page GENERAL: no distress Pulses: dorsalis pedis intact bilat.   MSK: no deformity of the feet CV:  trace bilat leg edema, and bilat vv's.   Skin:  no ulcer on the feet.  normal color and temp on the feet.   Neuro: sensation is intact to touch on the feet, but decreased from normal.    Lab Results  Component Value Date   HGBA1C 8.6 01/16/2017      Assessment & Plan:  Insulin-requiring type 2 DM, with CVA: she needs increased rx.    Patient Instructions  check your blood sugar once a day.  vary the time of day when you check, between before the 3 meals, and at bedtime.  also check if you have symptoms of your blood sugar being too high or too low.  please keep a record of the readings and bring it to your next appointment here (or you can bring the meter itself).  You can write it on any piece of paper.  please call us sooner if your blood sugar goes below 70, or if you have a lot of readings over 200. Please continue the same [place], and:  I have sent a prescription to your pharmacy, to add "jardiance."     Please come back for a follow-up appointment in 2 months.

## 2017-01-21 DIAGNOSIS — Z7901 Long term (current) use of anticoagulants: Secondary | ICD-10-CM | POA: Diagnosis not present

## 2017-02-05 DIAGNOSIS — Z7901 Long term (current) use of anticoagulants: Secondary | ICD-10-CM | POA: Diagnosis not present

## 2017-02-18 ENCOUNTER — Ambulatory Visit: Payer: Medicare Other | Admitting: Podiatry

## 2017-02-21 DIAGNOSIS — Z7901 Long term (current) use of anticoagulants: Secondary | ICD-10-CM | POA: Diagnosis not present

## 2017-03-18 DIAGNOSIS — Z6831 Body mass index (BMI) 31.0-31.9, adult: Secondary | ICD-10-CM | POA: Diagnosis not present

## 2017-03-18 DIAGNOSIS — I638 Other cerebral infarction: Secondary | ICD-10-CM | POA: Diagnosis not present

## 2017-03-18 DIAGNOSIS — H268 Other specified cataract: Secondary | ICD-10-CM | POA: Diagnosis not present

## 2017-03-18 DIAGNOSIS — E1165 Type 2 diabetes mellitus with hyperglycemia: Secondary | ICD-10-CM | POA: Diagnosis not present

## 2017-03-18 DIAGNOSIS — Z23 Encounter for immunization: Secondary | ICD-10-CM | POA: Diagnosis not present

## 2017-03-18 DIAGNOSIS — E038 Other specified hypothyroidism: Secondary | ICD-10-CM | POA: Diagnosis not present

## 2017-03-19 ENCOUNTER — Encounter: Payer: Self-pay | Admitting: Endocrinology

## 2017-03-19 ENCOUNTER — Ambulatory Visit (INDEPENDENT_AMBULATORY_CARE_PROVIDER_SITE_OTHER): Payer: Medicare Other | Admitting: Endocrinology

## 2017-03-19 VITALS — BP 122/68 | HR 94 | Wt 169.6 lb

## 2017-03-19 DIAGNOSIS — E1151 Type 2 diabetes mellitus with diabetic peripheral angiopathy without gangrene: Secondary | ICD-10-CM

## 2017-03-19 DIAGNOSIS — I639 Cerebral infarction, unspecified: Secondary | ICD-10-CM | POA: Diagnosis not present

## 2017-03-19 NOTE — Progress Notes (Signed)
Subjective:    Patient ID: Valerie Washington, female    DOB: May 21, 1935, 82 y.o.   MRN: [id number]  HPI Pt returns for f/u of diabetes mellitus: DM type: 2 Dx'ed: 2007 Complications: polyneuropathy and CVA Therapy: 2 oral meds GDM: never DKA: never Severe hypoglycemia: never Pancreatitis: never Pancreatic imaging: never.   Other: she has never been on insulin; she did not tolerate metformin (nausea and dizziness); edema also limits rx options.    Interval history: no cbg record, but states cbg's are in the low-100's.  pt states she feels well in general.  Past Medical History:  Diagnosis Date  . Anxiety   . CVA (cerebral vascular accident) (HCC) 11/20/2016  . Diabetes (HCC) 11/20/2016  . HTN (hypertension)   . Hyperlipemia, mixed   . Hypothyroidism     Past Surgical History:  Procedure Laterality Date  . REPLACEMENT TOTAL KNEE      Social History   Social History  . Marital status: Married    Spouse name: N/A  . Number of children: N/A  . Years of education: N/A   Occupational History  . Not on file.   Social History Main Topics  . Smoking status: Never Smoker  . Smokeless tobacco: Never Used  . Alcohol use No  . Drug use: No  . Sexual activity: Not on file     Comment: MARRIED   Other Topics Concern  . Not on file   Social History Narrative  . No narrative on file    Current Outpatient Prescriptions on File Prior to Visit  Medication Sig Dispense Refill  . cycloSPORINE (RESTASIS) 0.05 % ophthalmic emulsion 1 drop as needed (Use as directed in affected eye).    Marland Kitchen empagliflozin (JARDIANCE) 10 MG TABS tablet Take 10 mg by mouth daily. 30 tablet 11  . fluticasone (FLONASE) 50 MCG/ACT nasal spray Place 1 spray into both nostrils daily as needed for allergies or rhinitis.    Marland Kitchen gabapentin (NEURONTIN) 100 MG capsule Take 1 capsule (100 mg total) by mouth at bedtime. 90 capsule 3  . Levothyroxine Sodium (LEVOTHROID PO) Take 75 mcg by mouth daily.     . metoprolol  tartrate (LOPRESSOR) 50 MG tablet Take 25 mg by mouth 2 (two) times daily.    . sertraline (ZOLOFT) 100 MG tablet Take 100 mg by mouth daily.    . sitaGLIPtin (JANUVIA) 100 MG tablet Take 100 mg by mouth daily.     Marland Kitchen warfarin (COUMADIN) 3 MG tablet Take 3 mg by mouth daily.      No current facility-administered medications on file prior to visit.     Allergies  Allergen Reactions  . Codeine Nausea Only  . Metformin And Related     Lack of therapeutic effect    Family History  Problem Relation Age of Onset  . Diabetes Father   . CVA Father   . Canavan disease Mother   . Colon cancer Mother   . Heart disease Other        7 SIBLINGS DIED FROM HEART DISEASE  . Heart disease Sister   . Heart disease Brother     BP 122/68   Pulse 94   Wt 169 lb 9.6 oz (76.9 kg)   SpO2 96%   BMI 31.53 kg/m    Review of Systems He denies hypoglycemia    Objective:   Physical Exam VITAL SIGNS:  See vs page GENERAL: no distress Pulses: foot pulses are intact bilaterally.   MSK: no deformity  of the feet or ankles.  CV: no edema of the legs or ankles Skin:  no ulcer on the feet or ankles.  normal color and temp on the feet and ankles.  Old healed surgical scar at the dorsal aspect of the right foot. Neuro: sensation is intact to touch on the feet and ankles.     outside test results are reviewed: A1c=7.8%    Assessment & Plan:  Type 2 DM, with polyneuropathy: well-controlled.  Frail elderly state: in this setting, she is not a candidate for aggressive glycemic control.  Edema: resolved off pioglitizone  Patient Instructions  check your blood sugar once a day.  vary the time of day when you check, between before the 3 meals, and at bedtime.  also check if you have symptoms of your blood sugar being too high or too low.  please keep a record of the readings and bring it to your next appointment here (or you can bring the meter itself).  You can write it on any piece of paper.  please call  us sooner if your blood sugar goes below 70, or if you have a lot of readings over 200. Please continue the same medications.  Please come back for a follow-up appointment in 4 months.

## 2017-03-19 NOTE — Patient Instructions (Addendum)

## 2017-03-27 DIAGNOSIS — H5202 Hypermetropia, left eye: Secondary | ICD-10-CM | POA: Diagnosis not present

## 2017-03-27 DIAGNOSIS — H52223 Regular astigmatism, bilateral: Secondary | ICD-10-CM | POA: Diagnosis not present

## 2017-03-27 DIAGNOSIS — E119 Type 2 diabetes mellitus without complications: Secondary | ICD-10-CM | POA: Diagnosis not present

## 2017-03-27 DIAGNOSIS — Z961 Presence of intraocular lens: Secondary | ICD-10-CM | POA: Diagnosis not present

## 2017-04-03 DIAGNOSIS — I4891 Unspecified atrial fibrillation: Secondary | ICD-10-CM | POA: Diagnosis not present

## 2017-04-03 DIAGNOSIS — Z7901 Long term (current) use of anticoagulants: Secondary | ICD-10-CM | POA: Diagnosis not present

## 2017-05-15 ENCOUNTER — Ambulatory Visit: Payer: Medicare Other | Admitting: Podiatry

## 2017-06-05 ENCOUNTER — Other Ambulatory Visit (HOSPITAL_COMMUNITY): Payer: Self-pay | Admitting: Internal Medicine

## 2017-06-05 ENCOUNTER — Telehealth: Payer: Self-pay | Admitting: Endocrinology

## 2017-06-05 ENCOUNTER — Ambulatory Visit (HOSPITAL_COMMUNITY)
Admission: RE | Admit: 2017-06-05 | Discharge: 2017-06-05 | Disposition: A | Discharge status: Home / Self Care | Payer: Medicare Other | Source: Ambulatory Visit | Attending: Internal Medicine | Admitting: Internal Medicine

## 2017-06-05 ENCOUNTER — Encounter (HOSPITAL_COMMUNITY): Payer: Self-pay | Admitting: Radiology

## 2017-06-05 DIAGNOSIS — R1011 Right upper quadrant pain: Secondary | ICD-10-CM | POA: Diagnosis not present

## 2017-06-05 DIAGNOSIS — R1031 Right lower quadrant pain: Secondary | ICD-10-CM | POA: Insufficient documentation

## 2017-06-05 DIAGNOSIS — K5730 Diverticulosis of large intestine without perforation or abscess without bleeding: Secondary | ICD-10-CM | POA: Insufficient documentation

## 2017-06-05 DIAGNOSIS — Z6831 Body mass index (BMI) 31.0-31.9, adult: Secondary | ICD-10-CM | POA: Diagnosis not present

## 2017-06-05 DIAGNOSIS — N2889 Other specified disorders of kidney and ureter: Secondary | ICD-10-CM | POA: Insufficient documentation

## 2017-06-05 DIAGNOSIS — I700 Atherosclerosis of aorta: Secondary | ICD-10-CM | POA: Insufficient documentation

## 2017-06-05 MED ORDER — IOPAMIDOL (ISOVUE-300) INJECTION 61%
INTRAVENOUS | Status: AC
  Administered 2017-06-05: 30 mL via ORAL
  Filled 2017-06-05: qty 30

## 2017-06-05 MED ORDER — IOPAMIDOL (ISOVUE-300) INJECTION 61%
INTRAVENOUS | Status: AC
  Administered 2017-06-05: 100 mL via INTRAVENOUS
  Filled 2017-06-05: qty 100

## 2017-06-05 MED ORDER — DAPAGLIFLOZIN PROPANEDIOL 5 MG PO TABS: 5.0000 mg | ORAL_TABLET | Freq: Every day | ORAL | 11 refills | Status: DC

## 2017-06-05 NOTE — Telephone Encounter (Signed)
Ok, I have sent a prescription to your pharmacy, to change to farxiga.

## 2017-06-05 NOTE — Telephone Encounter (Signed)
Pt son Domonique Cothran called states JARDIANCE 10 mg making pt feel crawly and itchy. Iantha Fallen states medication was prescribed as bumper to Januvia.  Please advise. Also pt uses [facility].

## 2017-06-06 ENCOUNTER — Other Ambulatory Visit: Payer: Self-pay

## 2017-06-06 MED ORDER — DAPAGLIFLOZIN PROPANEDIOL 5 MG PO TABS: 5.0000 mg | ORAL_TABLET | Freq: Every day | ORAL | 11 refills | Status: DC

## 2017-06-06 NOTE — Telephone Encounter (Signed)
Called and informed patient's son that prescription was sent in.

## 2017-06-10 ENCOUNTER — Other Ambulatory Visit: Payer: Self-pay | Admitting: Internal Medicine

## 2017-06-12 ENCOUNTER — Other Ambulatory Visit: Payer: Self-pay | Admitting: Internal Medicine

## 2017-06-12 DIAGNOSIS — N2889 Other specified disorders of kidney and ureter: Secondary | ICD-10-CM

## 2017-06-13 ENCOUNTER — Other Ambulatory Visit: Payer: Self-pay

## 2017-06-13 ENCOUNTER — Telehealth: Payer: Self-pay | Admitting: Endocrinology

## 2017-06-13 MED ORDER — CANAGLIFLOZIN 100 MG PO TABS: 100.0000 mg | ORAL_TABLET | Freq: Every day | ORAL | 11 refills | Status: AC

## 2017-06-13 MED ORDER — EMPAGLIFLOZIN 10 MG PO TABS: 10.0000 mg | ORAL_TABLET | Freq: Every day | ORAL | 11 refills | Status: DC

## 2017-06-13 MED ORDER — CANAGLIFLOZIN 100 MG PO TABS: 100.0000 mg | ORAL_TABLET | Freq: Every day | ORAL | 11 refills | Status: DC

## 2017-06-13 NOTE — Telephone Encounter (Signed)
please call patient: Ins wants you to change farxiga to jardiance.  These are similar, so I have sent a prescription to your pharmacy, to change. I'll see you next time.

## 2017-06-13 NOTE — Telephone Encounter (Signed)
I have sent a prescription to your pharmacy, to change to invokana

## 2017-06-13 NOTE — Telephone Encounter (Signed)
Patient's husband states that she has also tried invokana but that hurts her stomach. That's why she was put on the jardiance to begin with.

## 2017-06-13 NOTE — Telephone Encounter (Signed)
Please continue the same medications.  We'll hold off on adding. I'll see you next time.

## 2017-06-13 NOTE — Telephone Encounter (Signed)
Patient's husband stated that she was taking jardiance before but was causing her to itch so that's when I guess she was changed to farxiga. They never received or picked up farxiga however. She has only been taking Januvia & he stated that most her BS readings are in the 140's. He was wondering was another medication even necessary?

## 2017-06-18 NOTE — Telephone Encounter (Signed)
I called and spoke with patient's husband. He stated they also have physicals coming up soon, so things could be addressed at that time if needed.

## 2017-06-20 ENCOUNTER — Ambulatory Visit
Admission: RE | Admit: 2017-06-20 | Discharge: 2017-06-20 | Disposition: A | Discharge status: Home / Self Care | Payer: Medicare Other | Source: Ambulatory Visit | Attending: Internal Medicine | Admitting: Internal Medicine

## 2017-06-20 DIAGNOSIS — N2889 Other specified disorders of kidney and ureter: Secondary | ICD-10-CM

## 2017-06-20 DIAGNOSIS — N281 Cyst of kidney, acquired: Secondary | ICD-10-CM | POA: Diagnosis not present

## 2017-06-20 MED ORDER — GADOBENATE DIMEGLUMINE 529 MG/ML IV SOLN
15.0000 mL | Freq: Once | INTRAVENOUS | Status: AC | PRN
  Administered 2017-06-20: 15 mL via INTRAVENOUS

## 2017-07-14 DIAGNOSIS — E1165 Type 2 diabetes mellitus with hyperglycemia: Secondary | ICD-10-CM | POA: Diagnosis not present

## 2017-07-14 DIAGNOSIS — E038 Other specified hypothyroidism: Secondary | ICD-10-CM | POA: Diagnosis not present

## 2017-07-14 DIAGNOSIS — Z79899 Other long term (current) drug therapy: Secondary | ICD-10-CM | POA: Diagnosis not present

## 2017-07-15 DIAGNOSIS — R82998 Other abnormal findings in urine: Secondary | ICD-10-CM | POA: Diagnosis not present

## 2017-07-15 DIAGNOSIS — E1165 Type 2 diabetes mellitus with hyperglycemia: Secondary | ICD-10-CM | POA: Diagnosis not present

## 2017-07-21 ENCOUNTER — Ambulatory Visit: Payer: Medicare Other | Admitting: Endocrinology

## 2017-07-21 DIAGNOSIS — Z780 Asymptomatic menopausal state: Secondary | ICD-10-CM | POA: Diagnosis not present

## 2017-07-21 DIAGNOSIS — N2889 Other specified disorders of kidney and ureter: Secondary | ICD-10-CM | POA: Diagnosis not present

## 2017-07-21 DIAGNOSIS — I4891 Unspecified atrial fibrillation: Secondary | ICD-10-CM | POA: Diagnosis not present

## 2017-07-21 DIAGNOSIS — E038 Other specified hypothyroidism: Secondary | ICD-10-CM | POA: Diagnosis not present

## 2017-07-21 DIAGNOSIS — Z1389 Encounter for screening for other disorder: Secondary | ICD-10-CM | POA: Diagnosis not present

## 2017-07-21 DIAGNOSIS — Z0000 Encounter for general adult medical examination without abnormal findings: Secondary | ICD-10-CM | POA: Diagnosis not present

## 2017-07-21 DIAGNOSIS — E7849 Other hyperlipidemia: Secondary | ICD-10-CM | POA: Diagnosis not present

## 2017-07-21 DIAGNOSIS — Z6831 Body mass index (BMI) 31.0-31.9, adult: Secondary | ICD-10-CM | POA: Diagnosis not present

## 2017-07-21 DIAGNOSIS — E1165 Type 2 diabetes mellitus with hyperglycemia: Secondary | ICD-10-CM | POA: Diagnosis not present

## 2017-07-21 DIAGNOSIS — Z7901 Long term (current) use of anticoagulants: Secondary | ICD-10-CM | POA: Diagnosis not present

## 2017-07-24 ENCOUNTER — Ambulatory Visit: Payer: Medicare Other | Admitting: Endocrinology

## 2017-08-07 DIAGNOSIS — M810 Age-related osteoporosis without current pathological fracture: Secondary | ICD-10-CM | POA: Diagnosis not present

## 2017-08-07 DIAGNOSIS — Z6832 Body mass index (BMI) 32.0-32.9, adult: Secondary | ICD-10-CM | POA: Diagnosis not present

## 2017-08-15 DIAGNOSIS — R05 Cough: Secondary | ICD-10-CM | POA: Diagnosis not present

## 2017-08-15 DIAGNOSIS — H6123 Impacted cerumen, bilateral: Secondary | ICD-10-CM | POA: Diagnosis not present

## 2017-10-13 DIAGNOSIS — E119 Type 2 diabetes mellitus without complications: Secondary | ICD-10-CM | POA: Diagnosis not present

## 2017-10-13 DIAGNOSIS — E663 Overweight: Secondary | ICD-10-CM | POA: Diagnosis not present

## 2017-10-13 DIAGNOSIS — Z1331 Encounter for screening for depression: Secondary | ICD-10-CM | POA: Diagnosis not present

## 2017-10-13 DIAGNOSIS — I10 Essential (primary) hypertension: Secondary | ICD-10-CM | POA: Diagnosis not present

## 2017-10-15 DIAGNOSIS — E119 Type 2 diabetes mellitus without complications: Secondary | ICD-10-CM | POA: Diagnosis not present

## 2017-10-15 DIAGNOSIS — R5383 Other fatigue: Secondary | ICD-10-CM | POA: Diagnosis not present

## 2017-10-15 DIAGNOSIS — E559 Vitamin D deficiency, unspecified: Secondary | ICD-10-CM | POA: Diagnosis not present

## 2017-10-15 DIAGNOSIS — I10 Essential (primary) hypertension: Secondary | ICD-10-CM | POA: Diagnosis not present

## 2017-11-04 DIAGNOSIS — I10 Essential (primary) hypertension: Secondary | ICD-10-CM | POA: Diagnosis not present

## 2017-11-04 DIAGNOSIS — Z789 Other specified health status: Secondary | ICD-10-CM | POA: Diagnosis not present

## 2017-11-04 DIAGNOSIS — E663 Overweight: Secondary | ICD-10-CM | POA: Diagnosis not present

## 2017-11-04 DIAGNOSIS — E119 Type 2 diabetes mellitus without complications: Secondary | ICD-10-CM | POA: Diagnosis not present

## 2017-12-15 DIAGNOSIS — Z856 Personal history of leukemia: Secondary | ICD-10-CM | POA: Diagnosis not present

## 2017-12-15 DIAGNOSIS — I6310 Cerebral infarction due to embolism of unspecified precerebral artery: Secondary | ICD-10-CM | POA: Diagnosis not present

## 2017-12-15 DIAGNOSIS — Z6833 Body mass index (BMI) 33.0-33.9, adult: Secondary | ICD-10-CM | POA: Diagnosis not present

## 2017-12-15 DIAGNOSIS — E559 Vitamin D deficiency, unspecified: Secondary | ICD-10-CM | POA: Diagnosis not present

## 2017-12-15 DIAGNOSIS — M810 Age-related osteoporosis without current pathological fracture: Secondary | ICD-10-CM | POA: Diagnosis not present

## 2017-12-15 DIAGNOSIS — I10 Essential (primary) hypertension: Secondary | ICD-10-CM | POA: Diagnosis not present

## 2017-12-15 DIAGNOSIS — E039 Hypothyroidism, unspecified: Secondary | ICD-10-CM | POA: Diagnosis not present

## 2017-12-15 DIAGNOSIS — I499 Cardiac arrhythmia, unspecified: Secondary | ICD-10-CM | POA: Diagnosis not present

## 2017-12-15 DIAGNOSIS — F09 Unspecified mental disorder due to known physiological condition: Secondary | ICD-10-CM | POA: Diagnosis not present

## 2017-12-15 DIAGNOSIS — E119 Type 2 diabetes mellitus without complications: Secondary | ICD-10-CM | POA: Diagnosis not present

## 2017-12-29 DIAGNOSIS — E1142 Type 2 diabetes mellitus with diabetic polyneuropathy: Secondary | ICD-10-CM | POA: Diagnosis not present

## 2017-12-29 DIAGNOSIS — L851 Acquired keratosis [keratoderma] palmaris et plantaris: Secondary | ICD-10-CM | POA: Diagnosis not present

## 2017-12-29 DIAGNOSIS — E119 Type 2 diabetes mellitus without complications: Secondary | ICD-10-CM | POA: Diagnosis not present

## 2017-12-29 DIAGNOSIS — E1165 Type 2 diabetes mellitus with hyperglycemia: Secondary | ICD-10-CM | POA: Diagnosis not present

## 2017-12-30 DIAGNOSIS — Z96651 Presence of right artificial knee joint: Secondary | ICD-10-CM | POA: Diagnosis not present

## 2017-12-30 DIAGNOSIS — T8484XA Pain due to internal orthopedic prosthetic devices, implants and grafts, initial encounter: Secondary | ICD-10-CM | POA: Diagnosis not present

## 2017-12-30 DIAGNOSIS — Z96652 Presence of left artificial knee joint: Secondary | ICD-10-CM | POA: Diagnosis not present

## 2018-02-24 DIAGNOSIS — S0081XA Abrasion of other part of head, initial encounter: Secondary | ICD-10-CM | POA: Diagnosis not present

## 2018-02-24 DIAGNOSIS — M79641 Pain in right hand: Secondary | ICD-10-CM | POA: Diagnosis not present

## 2018-02-24 DIAGNOSIS — E039 Hypothyroidism, unspecified: Secondary | ICD-10-CM | POA: Diagnosis not present

## 2018-02-24 DIAGNOSIS — G8911 Acute pain due to trauma: Secondary | ICD-10-CM | POA: Diagnosis not present

## 2018-02-24 DIAGNOSIS — S8991XA Unspecified injury of right lower leg, initial encounter: Secondary | ICD-10-CM | POA: Diagnosis not present

## 2018-02-24 DIAGNOSIS — Z7901 Long term (current) use of anticoagulants: Secondary | ICD-10-CM | POA: Diagnosis not present

## 2018-02-24 DIAGNOSIS — R51 Headache: Secondary | ICD-10-CM | POA: Diagnosis not present

## 2018-02-24 DIAGNOSIS — M542 Cervicalgia: Secondary | ICD-10-CM | POA: Diagnosis not present

## 2018-02-24 DIAGNOSIS — S6991XA Unspecified injury of right wrist, hand and finger(s), initial encounter: Secondary | ICD-10-CM | POA: Diagnosis not present

## 2018-02-24 DIAGNOSIS — S52501A Unspecified fracture of the lower end of right radius, initial encounter for closed fracture: Secondary | ICD-10-CM | POA: Diagnosis not present

## 2018-02-24 DIAGNOSIS — M25561 Pain in right knee: Secondary | ICD-10-CM | POA: Diagnosis not present

## 2018-02-24 DIAGNOSIS — E7800 Pure hypercholesterolemia, unspecified: Secondary | ICD-10-CM | POA: Diagnosis not present

## 2018-02-24 DIAGNOSIS — Z79899 Other long term (current) drug therapy: Secondary | ICD-10-CM | POA: Diagnosis not present

## 2018-02-24 DIAGNOSIS — S52611A Displaced fracture of right ulna styloid process, initial encounter for closed fracture: Secondary | ICD-10-CM | POA: Diagnosis not present

## 2018-02-24 DIAGNOSIS — Z885 Allergy status to narcotic agent status: Secondary | ICD-10-CM | POA: Diagnosis not present

## 2018-02-24 DIAGNOSIS — E119 Type 2 diabetes mellitus without complications: Secondary | ICD-10-CM | POA: Diagnosis not present

## 2018-02-24 DIAGNOSIS — S199XXA Unspecified injury of neck, initial encounter: Secondary | ICD-10-CM | POA: Diagnosis not present

## 2018-02-24 DIAGNOSIS — I10 Essential (primary) hypertension: Secondary | ICD-10-CM | POA: Diagnosis not present

## 2018-02-24 DIAGNOSIS — S0990XA Unspecified injury of head, initial encounter: Secondary | ICD-10-CM | POA: Diagnosis not present

## 2018-02-24 DIAGNOSIS — Z7984 Long term (current) use of oral hypoglycemic drugs: Secondary | ICD-10-CM | POA: Diagnosis not present

## 2018-02-24 DIAGNOSIS — S52124A Nondisplaced fracture of head of right radius, initial encounter for closed fracture: Secondary | ICD-10-CM | POA: Diagnosis not present

## 2018-02-25 DIAGNOSIS — I480 Paroxysmal atrial fibrillation: Secondary | ICD-10-CM | POA: Diagnosis not present

## 2018-02-25 DIAGNOSIS — E782 Mixed hyperlipidemia: Secondary | ICD-10-CM | POA: Diagnosis not present

## 2018-02-25 DIAGNOSIS — I10 Essential (primary) hypertension: Secondary | ICD-10-CM | POA: Diagnosis not present

## 2018-02-25 DIAGNOSIS — I2510 Atherosclerotic heart disease of native coronary artery without angina pectoris: Secondary | ICD-10-CM | POA: Diagnosis not present

## 2018-02-25 DIAGNOSIS — R0602 Shortness of breath: Secondary | ICD-10-CM | POA: Diagnosis not present

## 2018-03-05 DIAGNOSIS — S52501A Unspecified fracture of the lower end of right radius, initial encounter for closed fracture: Secondary | ICD-10-CM | POA: Diagnosis not present

## 2018-03-24 DIAGNOSIS — R0602 Shortness of breath: Secondary | ICD-10-CM | POA: Diagnosis not present

## 2018-03-24 DIAGNOSIS — I480 Paroxysmal atrial fibrillation: Secondary | ICD-10-CM | POA: Diagnosis not present

## 2018-03-26 DIAGNOSIS — E119 Type 2 diabetes mellitus without complications: Secondary | ICD-10-CM | POA: Diagnosis not present

## 2018-03-26 DIAGNOSIS — M8000XG Age-related osteoporosis with current pathological fracture, unspecified site, subsequent encounter for fracture with delayed healing: Secondary | ICD-10-CM | POA: Diagnosis not present

## 2018-03-26 DIAGNOSIS — I6310 Cerebral infarction due to embolism of unspecified precerebral artery: Secondary | ICD-10-CM | POA: Diagnosis not present

## 2018-03-26 DIAGNOSIS — N183 Chronic kidney disease, stage 3 (moderate): Secondary | ICD-10-CM | POA: Diagnosis not present

## 2018-03-26 DIAGNOSIS — Z6833 Body mass index (BMI) 33.0-33.9, adult: Secondary | ICD-10-CM | POA: Diagnosis not present

## 2018-03-26 DIAGNOSIS — Z23 Encounter for immunization: Secondary | ICD-10-CM | POA: Diagnosis not present

## 2018-03-26 DIAGNOSIS — I10 Essential (primary) hypertension: Secondary | ICD-10-CM | POA: Diagnosis not present

## 2018-03-26 DIAGNOSIS — E039 Hypothyroidism, unspecified: Secondary | ICD-10-CM | POA: Diagnosis not present

## 2018-03-26 DIAGNOSIS — E559 Vitamin D deficiency, unspecified: Secondary | ICD-10-CM | POA: Diagnosis not present

## 2018-04-09 DIAGNOSIS — S52501A Unspecified fracture of the lower end of right radius, initial encounter for closed fracture: Secondary | ICD-10-CM | POA: Diagnosis not present

## 2018-05-15 DIAGNOSIS — S52501D Unspecified fracture of the lower end of right radius, subsequent encounter for closed fracture with routine healing: Secondary | ICD-10-CM | POA: Diagnosis not present

## 2018-06-13 DIAGNOSIS — E039 Hypothyroidism, unspecified: Secondary | ICD-10-CM | POA: Diagnosis not present

## 2018-06-13 DIAGNOSIS — Z7901 Long term (current) use of anticoagulants: Secondary | ICD-10-CM | POA: Diagnosis not present

## 2018-06-13 DIAGNOSIS — Z7983 Long term (current) use of bisphosphonates: Secondary | ICD-10-CM | POA: Diagnosis not present

## 2018-06-13 DIAGNOSIS — E119 Type 2 diabetes mellitus without complications: Secondary | ICD-10-CM | POA: Diagnosis not present

## 2018-06-13 DIAGNOSIS — W228XXA Striking against or struck by other objects, initial encounter: Secondary | ICD-10-CM | POA: Diagnosis not present

## 2018-06-13 DIAGNOSIS — S2232XA Fracture of one rib, left side, initial encounter for closed fracture: Secondary | ICD-10-CM | POA: Diagnosis not present

## 2018-06-13 DIAGNOSIS — E7800 Pure hypercholesterolemia, unspecified: Secondary | ICD-10-CM | POA: Diagnosis not present

## 2018-06-13 DIAGNOSIS — Z79899 Other long term (current) drug therapy: Secondary | ICD-10-CM | POA: Diagnosis not present

## 2018-06-13 DIAGNOSIS — Z7984 Long term (current) use of oral hypoglycemic drugs: Secondary | ICD-10-CM | POA: Diagnosis not present

## 2018-06-13 DIAGNOSIS — I10 Essential (primary) hypertension: Secondary | ICD-10-CM | POA: Diagnosis not present

## 2018-06-16 DIAGNOSIS — R0602 Shortness of breath: Secondary | ICD-10-CM | POA: Diagnosis not present

## 2018-06-16 DIAGNOSIS — Z7901 Long term (current) use of anticoagulants: Secondary | ICD-10-CM | POA: Diagnosis not present

## 2018-06-16 DIAGNOSIS — S2232XA Fracture of one rib, left side, initial encounter for closed fracture: Secondary | ICD-10-CM | POA: Diagnosis not present

## 2018-06-16 DIAGNOSIS — E039 Hypothyroidism, unspecified: Secondary | ICD-10-CM | POA: Diagnosis not present

## 2018-06-16 DIAGNOSIS — Z7984 Long term (current) use of oral hypoglycemic drugs: Secondary | ICD-10-CM | POA: Diagnosis not present

## 2018-06-16 DIAGNOSIS — E119 Type 2 diabetes mellitus without complications: Secondary | ICD-10-CM | POA: Diagnosis not present

## 2018-06-16 DIAGNOSIS — E7800 Pure hypercholesterolemia, unspecified: Secondary | ICD-10-CM | POA: Diagnosis not present

## 2018-06-16 DIAGNOSIS — S2232XD Fracture of one rib, left side, subsequent encounter for fracture with routine healing: Secondary | ICD-10-CM | POA: Diagnosis not present

## 2018-06-16 DIAGNOSIS — I10 Essential (primary) hypertension: Secondary | ICD-10-CM | POA: Diagnosis not present

## 2018-06-16 DIAGNOSIS — Z79899 Other long term (current) drug therapy: Secondary | ICD-10-CM | POA: Diagnosis not present

## 2018-06-16 DIAGNOSIS — G8911 Acute pain due to trauma: Secondary | ICD-10-CM | POA: Diagnosis not present

## 2018-06-25 DIAGNOSIS — I10 Essential (primary) hypertension: Secondary | ICD-10-CM | POA: Diagnosis not present

## 2018-06-25 DIAGNOSIS — F09 Unspecified mental disorder due to known physiological condition: Secondary | ICD-10-CM | POA: Diagnosis not present

## 2018-06-25 DIAGNOSIS — F329 Major depressive disorder, single episode, unspecified: Secondary | ICD-10-CM | POA: Diagnosis not present

## 2018-06-25 DIAGNOSIS — S2242XS Multiple fractures of ribs, left side, sequela: Secondary | ICD-10-CM | POA: Diagnosis not present

## 2018-06-25 DIAGNOSIS — E119 Type 2 diabetes mellitus without complications: Secondary | ICD-10-CM | POA: Diagnosis not present

## 2018-06-25 DIAGNOSIS — E559 Vitamin D deficiency, unspecified: Secondary | ICD-10-CM | POA: Diagnosis not present

## 2018-06-25 DIAGNOSIS — R296 Repeated falls: Secondary | ICD-10-CM | POA: Diagnosis not present

## 2018-06-25 DIAGNOSIS — M8000XS Age-related osteoporosis with current pathological fracture, unspecified site, sequela: Secondary | ICD-10-CM | POA: Diagnosis not present

## 2018-06-25 DIAGNOSIS — N183 Chronic kidney disease, stage 3 (moderate): Secondary | ICD-10-CM | POA: Diagnosis not present

## 2018-06-25 DIAGNOSIS — I6310 Cerebral infarction due to embolism of unspecified precerebral artery: Secondary | ICD-10-CM | POA: Diagnosis not present

## 2018-06-25 DIAGNOSIS — E039 Hypothyroidism, unspecified: Secondary | ICD-10-CM | POA: Diagnosis not present

## 2018-06-25 DIAGNOSIS — Z6833 Body mass index (BMI) 33.0-33.9, adult: Secondary | ICD-10-CM | POA: Diagnosis not present

## 2018-07-06 DIAGNOSIS — E1142 Type 2 diabetes mellitus with diabetic polyneuropathy: Secondary | ICD-10-CM | POA: Diagnosis not present

## 2018-07-06 DIAGNOSIS — E1165 Type 2 diabetes mellitus with hyperglycemia: Secondary | ICD-10-CM | POA: Diagnosis not present

## 2018-07-06 DIAGNOSIS — L851 Acquired keratosis [keratoderma] palmaris et plantaris: Secondary | ICD-10-CM | POA: Diagnosis not present

## 2018-07-06 DIAGNOSIS — M2041 Other hammer toe(s) (acquired), right foot: Secondary | ICD-10-CM | POA: Diagnosis not present

## 2018-07-17 DIAGNOSIS — S52501D Unspecified fracture of the lower end of right radius, subsequent encounter for closed fracture with routine healing: Secondary | ICD-10-CM | POA: Diagnosis not present

## 2018-07-24 DIAGNOSIS — J40 Bronchitis, not specified as acute or chronic: Secondary | ICD-10-CM | POA: Diagnosis not present

## 2018-07-24 DIAGNOSIS — R062 Wheezing: Secondary | ICD-10-CM | POA: Diagnosis not present

## 2018-07-24 DIAGNOSIS — R05 Cough: Secondary | ICD-10-CM | POA: Diagnosis not present

## 2018-07-24 DIAGNOSIS — J0110 Acute frontal sinusitis, unspecified: Secondary | ICD-10-CM | POA: Diagnosis not present

## 2018-08-11 DIAGNOSIS — E039 Hypothyroidism, unspecified: Secondary | ICD-10-CM | POA: Diagnosis not present

## 2018-08-11 DIAGNOSIS — Z7984 Long term (current) use of oral hypoglycemic drugs: Secondary | ICD-10-CM | POA: Diagnosis not present

## 2018-08-11 DIAGNOSIS — R109 Unspecified abdominal pain: Secondary | ICD-10-CM | POA: Diagnosis not present

## 2018-08-11 DIAGNOSIS — Z885 Allergy status to narcotic agent status: Secondary | ICD-10-CM | POA: Diagnosis not present

## 2018-08-11 DIAGNOSIS — Z79899 Other long term (current) drug therapy: Secondary | ICD-10-CM | POA: Diagnosis not present

## 2018-08-11 DIAGNOSIS — Z7901 Long term (current) use of anticoagulants: Secondary | ICD-10-CM | POA: Diagnosis not present

## 2018-08-11 DIAGNOSIS — R0789 Other chest pain: Secondary | ICD-10-CM | POA: Diagnosis not present

## 2018-08-11 DIAGNOSIS — S3991XA Unspecified injury of abdomen, initial encounter: Secondary | ICD-10-CM | POA: Diagnosis not present

## 2018-08-11 DIAGNOSIS — G8911 Acute pain due to trauma: Secondary | ICD-10-CM | POA: Diagnosis not present

## 2018-08-11 DIAGNOSIS — E7800 Pure hypercholesterolemia, unspecified: Secondary | ICD-10-CM | POA: Diagnosis not present

## 2018-08-11 DIAGNOSIS — S3993XA Unspecified injury of pelvis, initial encounter: Secondary | ICD-10-CM | POA: Diagnosis not present

## 2018-08-11 DIAGNOSIS — I10 Essential (primary) hypertension: Secondary | ICD-10-CM | POA: Diagnosis not present

## 2018-08-11 DIAGNOSIS — E119 Type 2 diabetes mellitus without complications: Secondary | ICD-10-CM | POA: Diagnosis not present

## 2018-08-11 DIAGNOSIS — S2242XD Multiple fractures of ribs, left side, subsequent encounter for fracture with routine healing: Secondary | ICD-10-CM | POA: Diagnosis not present

## 2018-08-11 DIAGNOSIS — S2242XA Multiple fractures of ribs, left side, initial encounter for closed fracture: Secondary | ICD-10-CM | POA: Diagnosis not present

## 2018-08-28 DIAGNOSIS — E119 Type 2 diabetes mellitus without complications: Secondary | ICD-10-CM | POA: Diagnosis not present

## 2018-08-28 DIAGNOSIS — R05 Cough: Secondary | ICD-10-CM | POA: Diagnosis not present

## 2018-08-28 DIAGNOSIS — I10 Essential (primary) hypertension: Secondary | ICD-10-CM | POA: Diagnosis not present

## 2018-09-02 DIAGNOSIS — R410 Disorientation, unspecified: Secondary | ICD-10-CM | POA: Diagnosis not present

## 2018-09-02 DIAGNOSIS — D72829 Elevated white blood cell count, unspecified: Secondary | ICD-10-CM | POA: Diagnosis not present

## 2018-09-02 DIAGNOSIS — R05 Cough: Secondary | ICD-10-CM | POA: Diagnosis not present

## 2018-10-29 DIAGNOSIS — R0982 Postnasal drip: Secondary | ICD-10-CM | POA: Diagnosis not present

## 2018-10-29 DIAGNOSIS — J45991 Cough variant asthma: Secondary | ICD-10-CM | POA: Diagnosis not present

## 2018-10-29 DIAGNOSIS — R0600 Dyspnea, unspecified: Secondary | ICD-10-CM | POA: Diagnosis not present

## 2018-10-29 DIAGNOSIS — K219 Gastro-esophageal reflux disease without esophagitis: Secondary | ICD-10-CM | POA: Diagnosis not present

## 2018-10-29 DIAGNOSIS — R05 Cough: Secondary | ICD-10-CM | POA: Diagnosis not present

## 2018-11-12 DIAGNOSIS — R0982 Postnasal drip: Secondary | ICD-10-CM | POA: Diagnosis not present

## 2018-11-12 DIAGNOSIS — R0600 Dyspnea, unspecified: Secondary | ICD-10-CM | POA: Diagnosis not present

## 2018-11-12 DIAGNOSIS — J45991 Cough variant asthma: Secondary | ICD-10-CM | POA: Diagnosis not present

## 2018-11-12 DIAGNOSIS — K219 Gastro-esophageal reflux disease without esophagitis: Secondary | ICD-10-CM | POA: Diagnosis not present

## 2018-11-12 DIAGNOSIS — R05 Cough: Secondary | ICD-10-CM | POA: Diagnosis not present

## 2018-11-24 DIAGNOSIS — M79674 Pain in right toe(s): Secondary | ICD-10-CM | POA: Diagnosis not present

## 2018-11-24 DIAGNOSIS — E039 Hypothyroidism, unspecified: Secondary | ICD-10-CM | POA: Diagnosis not present

## 2018-11-24 DIAGNOSIS — E7800 Pure hypercholesterolemia, unspecified: Secondary | ICD-10-CM | POA: Diagnosis not present

## 2018-11-24 DIAGNOSIS — E119 Type 2 diabetes mellitus without complications: Secondary | ICD-10-CM | POA: Diagnosis not present

## 2018-11-24 DIAGNOSIS — M2041 Other hammer toe(s) (acquired), right foot: Secondary | ICD-10-CM | POA: Diagnosis not present

## 2018-11-24 DIAGNOSIS — I639 Cerebral infarction, unspecified: Secondary | ICD-10-CM | POA: Diagnosis not present

## 2018-11-24 DIAGNOSIS — Z79899 Other long term (current) drug therapy: Secondary | ICD-10-CM | POA: Diagnosis not present

## 2018-11-24 DIAGNOSIS — I10 Essential (primary) hypertension: Secondary | ICD-10-CM | POA: Diagnosis not present

## 2018-11-24 DIAGNOSIS — E1142 Type 2 diabetes mellitus with diabetic polyneuropathy: Secondary | ICD-10-CM | POA: Diagnosis not present

## 2018-11-24 DIAGNOSIS — F419 Anxiety disorder, unspecified: Secondary | ICD-10-CM | POA: Diagnosis not present

## 2018-11-24 DIAGNOSIS — R05 Cough: Secondary | ICD-10-CM | POA: Diagnosis not present

## 2018-11-24 DIAGNOSIS — M2042 Other hammer toe(s) (acquired), left foot: Secondary | ICD-10-CM | POA: Diagnosis not present

## 2018-12-03 DIAGNOSIS — E039 Hypothyroidism, unspecified: Secondary | ICD-10-CM | POA: Diagnosis not present

## 2018-12-03 DIAGNOSIS — E1165 Type 2 diabetes mellitus with hyperglycemia: Secondary | ICD-10-CM | POA: Diagnosis not present

## 2019-01-04 IMAGING — CT CT ABD-PELV W/ CM
2 of 6 series · 15 of 46 positions shown, 17 images · IV contrast (iopamidol)
Comparison: None.

CLINICAL DATA: Right lower quadrant pain since yesterday.

EXAM:
CT ABDOMEN AND PELVIS WITH CONTRAST
TECHNIQUE: Multidetector CT imaging of the abdomen and pelvis was performed
using the standard protocol following bolus administration of
intravenous contrast.
CONTRAST:  100mL XE222V-RUU IOPAMIDOL (XE222V-RUU) INJECTION 61%,
30mL [id number] IOPAMIDOL (XE222V-[id number]) INJECTION 61%

[Series 2: axial st · axial · 0.81mm/px · z∈[+1084,+1459]mm · 12 of 89 slices shown, 14 images]
[im 7/89  soft-tissue]
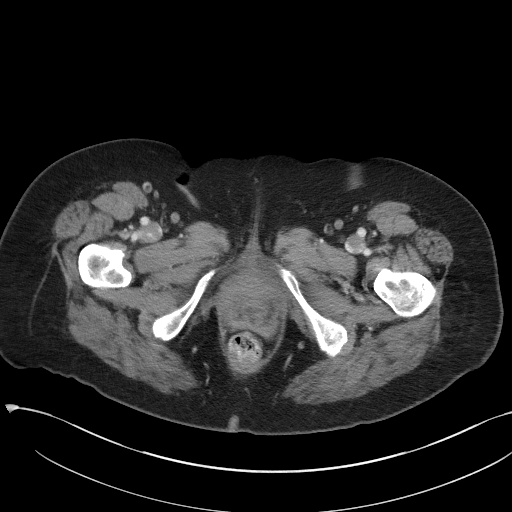
[im 7/89  bone]
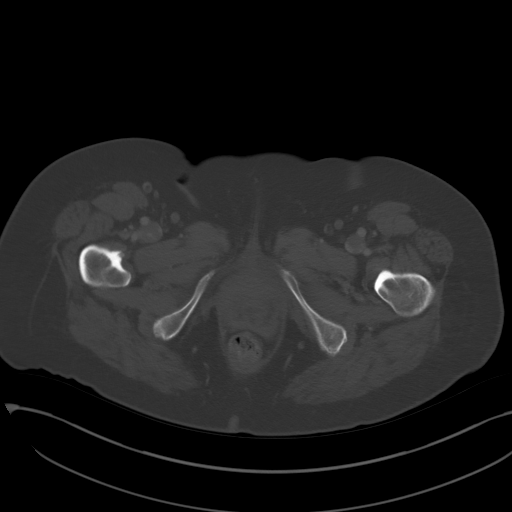
[im 13/89  soft-tissue]
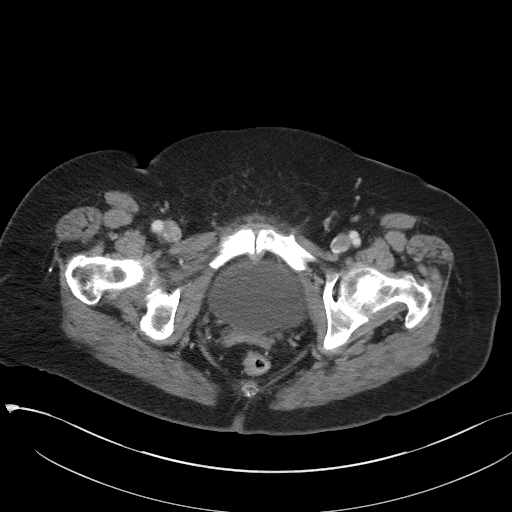
[im 19/89  soft-tissue]
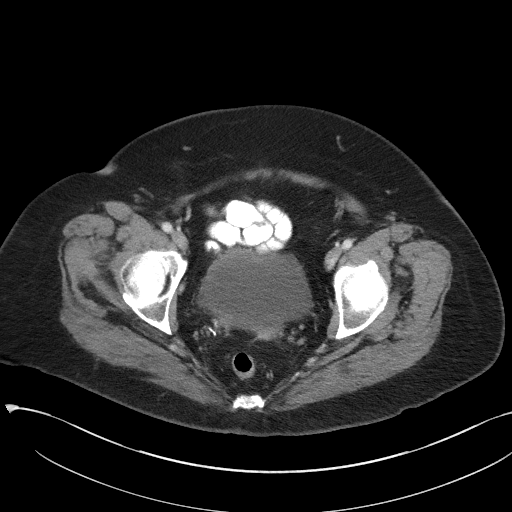
[im 26/89  soft-tissue]
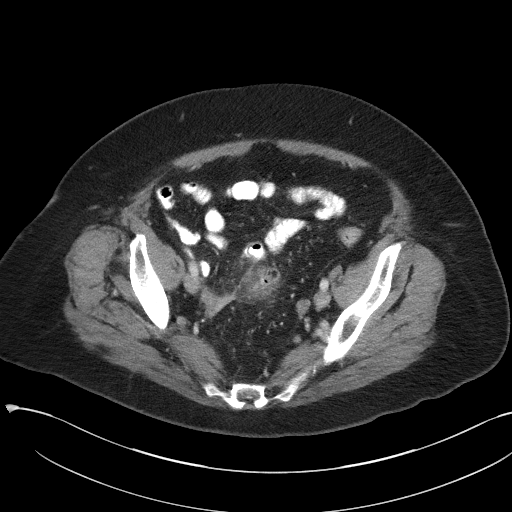
[im 32/89  soft-tissue]
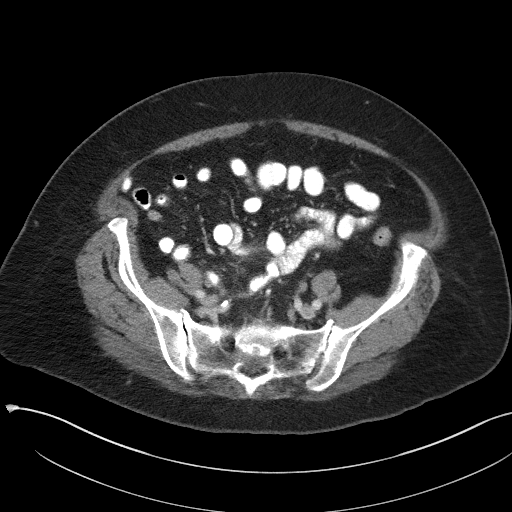
[im 38/89  soft-tissue]
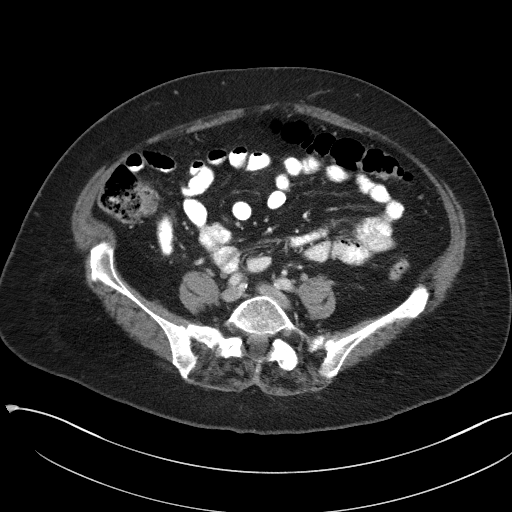
[im 51/89  soft-tissue]
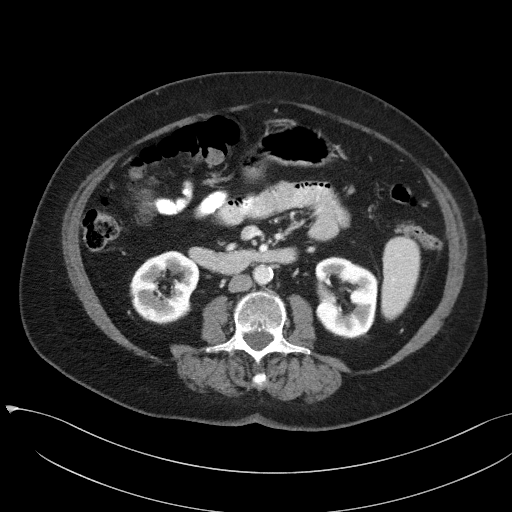
[im 57/89  soft-tissue]
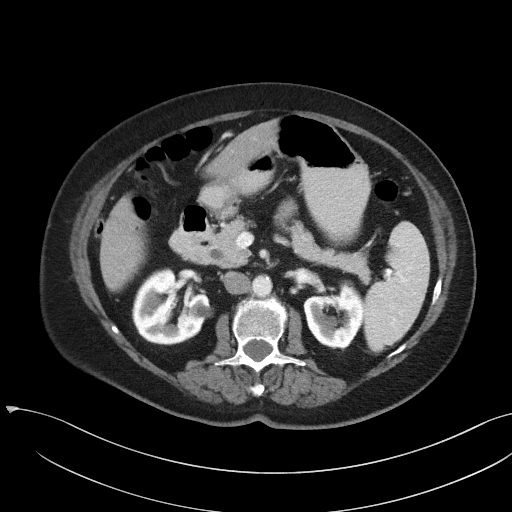
[im 63/89  soft-tissue]
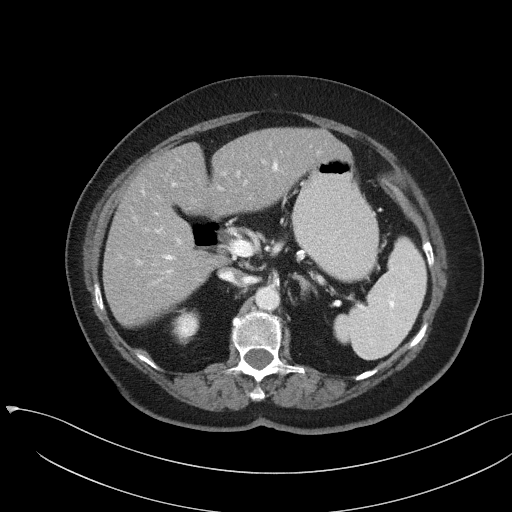
[im 63/89  bone]
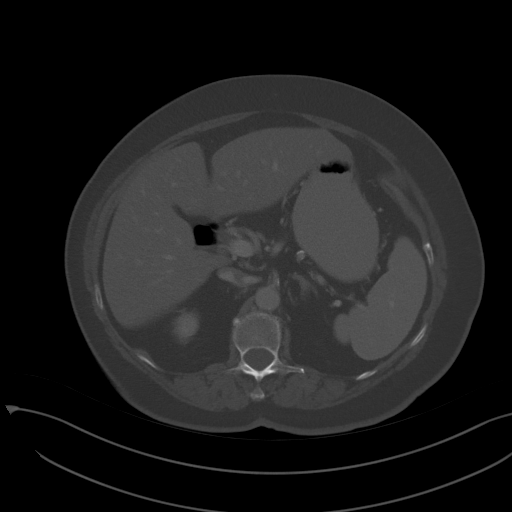
[im 70/89  soft-tissue]
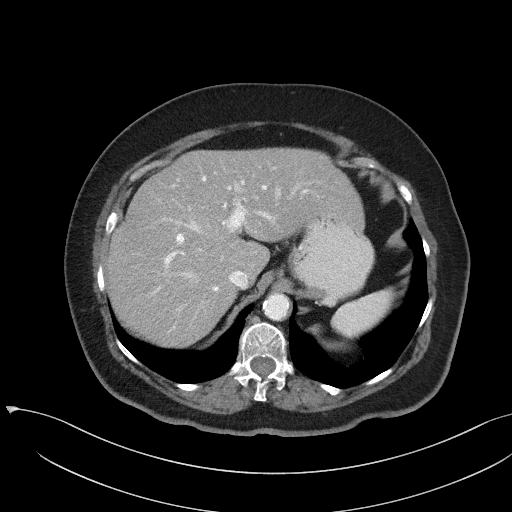
[im 76/89  soft-tissue]
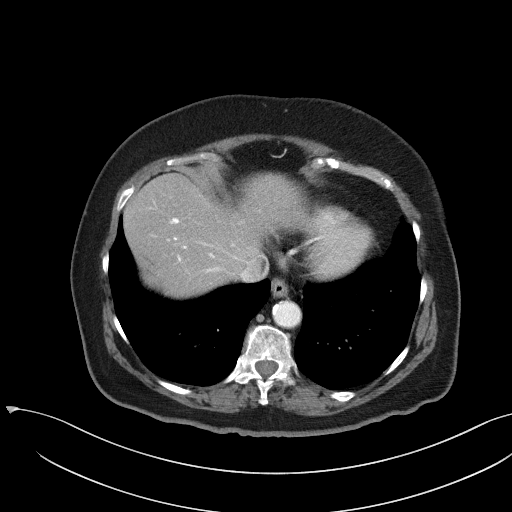
[im 82/89  soft-tissue]
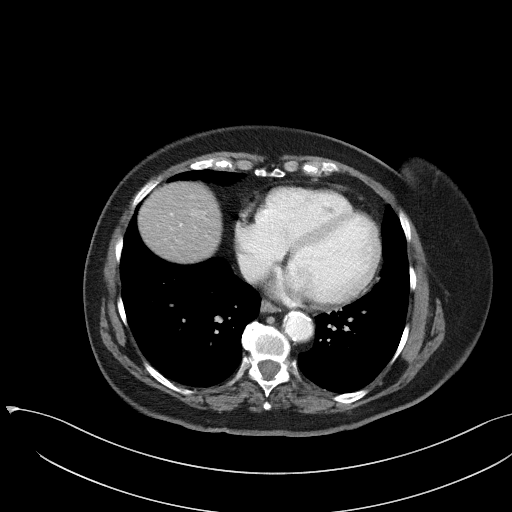

[Series 5: coronal st · coronal · 0.81mm/px · 3 of 102 slices shown]
[im 34/102  soft-tissue]
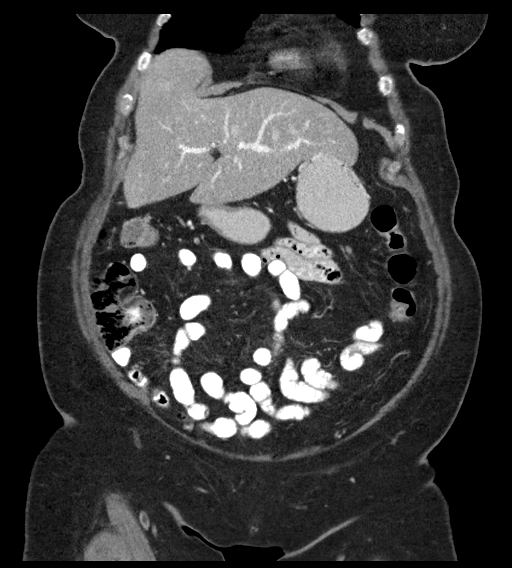
[im 45/102  soft-tissue]
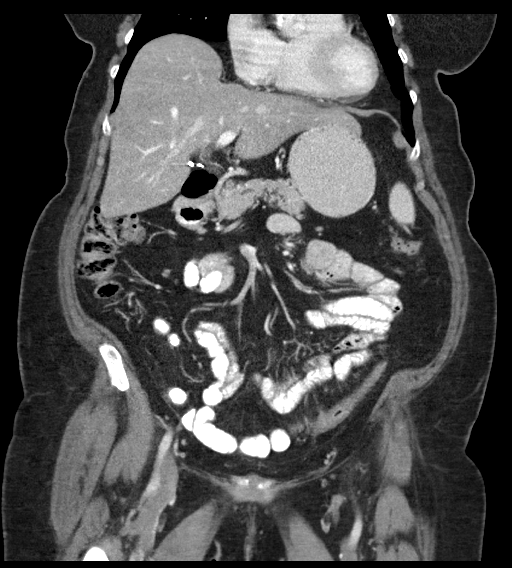
[im 57/102  soft-tissue]
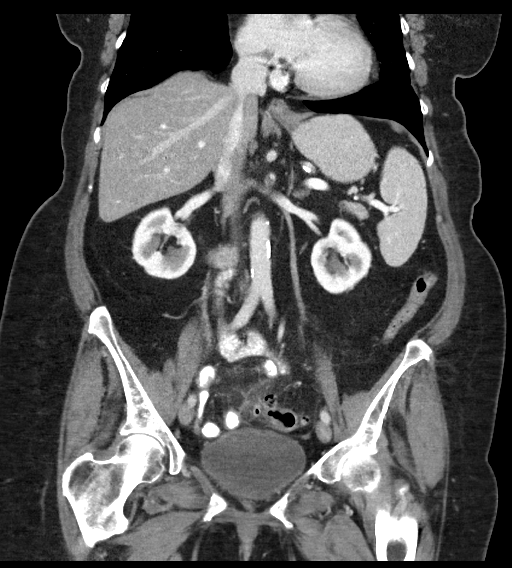

[15 of 46 positions shown; findings below may reference images not displayed]

FINDINGS: Lower chest: Unremarkable

Hepatobiliary: No focal abnormality within the liver parenchyma.
Gallbladder surgically absent. No intrahepatic or extrahepatic
biliary dilation.

Pancreas: No focal mass lesion. No dilatation of the main duct. No
intraparenchymal cyst. No peripancreatic edema.

Spleen: No splenomegaly. No focal mass lesion.

Adrenals/Urinary Tract: No adrenal nodule or mass. 13 mm lesion
interpolar right kidney (image 18 series 8) is ill-defined and has
attenuation too high to be a simple cyst. Neoplasm a concern. 12 mm
simple cyst identified medial cortex interpolar left kidney. Other
scattered tiny hypoattenuating lesions in each kidney are too small
to characterize but likely represent cysts. No evidence for
hydroureter. The urinary bladder appears normal for the degree of
distention.

Stomach/Bowel: Stomach is nondistended. No gastric wall thickening.
No evidence of outlet obstruction. Duodenum is normally positioned
as is the ligament of Treitz. Large duodenal diverticulum noted. No
small bowel wall thickening. No small bowel dilatation. The terminal
ileum is normal. The appendix is normal. Diverticular changes are
noted in the left colon and there is a focal area of wall thickening
in the distal sigmoid segment with pericolonic edema/ inflammation.
There appears to be inflamed diverticulum along the cranial margin
of this segment of colon. No gross perforation and no para colonic
abscess.

Vascular/Lymphatic: There is abdominal aortic atherosclerosis
without aneurysm. There is no gastrohepatic or hepatoduodenal
ligament lymphadenopathy. No intraperitoneal or retroperitoneal
lymphadenopathy. No pelvic sidewall lymphadenopathy.

Reproductive: Uterus surgically absent.  There is no adnexal mass.

Other: No intraperitoneal free fluid.

Musculoskeletal: Pelvic floor laxity evident. Bone windows reveal no
worrisome lytic or sclerotic osseous lesions.
IMPRESSION: 1. Left colonic diverticulosis with a short segment of wall
thickening in the distal sigmoid colon with pericolonic
edema/inflammation. Imaging features most compatible with
diverticulitis. Gas projecting just cranial to the colon wall on
image 63 is felt to be within a diverticulum. As such, there is no
evidence for perforation. There is no para colonic abscess.
2. 13 mm interpolar lesion right kidney with attenuation too high to
be a simple cyst. Neoplasm a concern. MRI of the abdomen without and
with contrast recommended to further evaluate.
3.  Aortic Atherosclerois ([id number]-170.0)
These results will be called to the ordering clinician or
representative by the Radiologist Assistant, and communication
documented in the PACS or zVision Dashboard.

## 2019-01-12 DIAGNOSIS — E039 Hypothyroidism, unspecified: Secondary | ICD-10-CM | POA: Diagnosis not present

## 2019-01-12 DIAGNOSIS — E7800 Pure hypercholesterolemia, unspecified: Secondary | ICD-10-CM | POA: Diagnosis not present

## 2019-01-12 DIAGNOSIS — E1165 Type 2 diabetes mellitus with hyperglycemia: Secondary | ICD-10-CM | POA: Diagnosis not present

## 2019-01-18 DIAGNOSIS — E7800 Pure hypercholesterolemia, unspecified: Secondary | ICD-10-CM | POA: Diagnosis not present

## 2019-01-18 DIAGNOSIS — R300 Dysuria: Secondary | ICD-10-CM | POA: Diagnosis not present

## 2019-01-18 DIAGNOSIS — E1165 Type 2 diabetes mellitus with hyperglycemia: Secondary | ICD-10-CM | POA: Diagnosis not present

## 2019-02-18 DIAGNOSIS — E1165 Type 2 diabetes mellitus with hyperglycemia: Secondary | ICD-10-CM | POA: Diagnosis not present

## 2019-02-18 DIAGNOSIS — N760 Acute vaginitis: Secondary | ICD-10-CM | POA: Diagnosis not present

## 2019-04-05 DIAGNOSIS — E119 Type 2 diabetes mellitus without complications: Secondary | ICD-10-CM | POA: Diagnosis not present

## 2019-04-05 DIAGNOSIS — H02831 Dermatochalasis of right upper eyelid: Secondary | ICD-10-CM | POA: Diagnosis not present

## 2019-04-05 DIAGNOSIS — H40013 Open angle with borderline findings, low risk, bilateral: Secondary | ICD-10-CM | POA: Diagnosis not present

## 2019-05-20 DIAGNOSIS — E7800 Pure hypercholesterolemia, unspecified: Secondary | ICD-10-CM | POA: Diagnosis not present

## 2019-05-20 DIAGNOSIS — D519 Vitamin B12 deficiency anemia, unspecified: Secondary | ICD-10-CM | POA: Diagnosis not present

## 2019-05-20 DIAGNOSIS — E559 Vitamin D deficiency, unspecified: Secondary | ICD-10-CM | POA: Diagnosis not present

## 2019-05-20 DIAGNOSIS — E1165 Type 2 diabetes mellitus with hyperglycemia: Secondary | ICD-10-CM | POA: Diagnosis not present

## 2019-05-27 DIAGNOSIS — Z0000 Encounter for general adult medical examination without abnormal findings: Secondary | ICD-10-CM | POA: Diagnosis not present

## 2019-05-27 DIAGNOSIS — T148XXA Other injury of unspecified body region, initial encounter: Secondary | ICD-10-CM | POA: Diagnosis not present

## 2019-05-27 DIAGNOSIS — R300 Dysuria: Secondary | ICD-10-CM | POA: Diagnosis not present

## 2019-05-27 DIAGNOSIS — N390 Urinary tract infection, site not specified: Secondary | ICD-10-CM | POA: Diagnosis not present

## 2019-08-27 DIAGNOSIS — R519 Headache, unspecified: Secondary | ICD-10-CM | POA: Diagnosis not present

## 2019-08-27 DIAGNOSIS — Z7901 Long term (current) use of anticoagulants: Secondary | ICD-10-CM | POA: Diagnosis not present

## 2019-08-27 DIAGNOSIS — F0390 Unspecified dementia without behavioral disturbance: Secondary | ICD-10-CM | POA: Diagnosis not present

## 2019-08-27 DIAGNOSIS — E119 Type 2 diabetes mellitus without complications: Secondary | ICD-10-CM | POA: Diagnosis not present

## 2019-08-27 DIAGNOSIS — Z8673 Personal history of transient ischemic attack (TIA), and cerebral infarction without residual deficits: Secondary | ICD-10-CM | POA: Diagnosis not present

## 2019-08-27 DIAGNOSIS — I10 Essential (primary) hypertension: Secondary | ICD-10-CM | POA: Diagnosis not present

## 2019-08-27 DIAGNOSIS — I482 Chronic atrial fibrillation, unspecified: Secondary | ICD-10-CM | POA: Diagnosis not present

## 2019-08-27 DIAGNOSIS — E7800 Pure hypercholesterolemia, unspecified: Secondary | ICD-10-CM | POA: Diagnosis not present

## 2019-08-27 DIAGNOSIS — I4891 Unspecified atrial fibrillation: Secondary | ICD-10-CM | POA: Diagnosis not present

## 2019-08-27 DIAGNOSIS — E039 Hypothyroidism, unspecified: Secondary | ICD-10-CM | POA: Diagnosis not present

## 2019-08-27 DIAGNOSIS — Z7984 Long term (current) use of oral hypoglycemic drugs: Secondary | ICD-10-CM | POA: Diagnosis not present

## 2019-09-01 DIAGNOSIS — E1140 Type 2 diabetes mellitus with diabetic neuropathy, unspecified: Secondary | ICD-10-CM | POA: Diagnosis not present

## 2019-09-01 DIAGNOSIS — F0150 Vascular dementia without behavioral disturbance: Secondary | ICD-10-CM | POA: Diagnosis not present

## 2019-09-01 DIAGNOSIS — R9089 Other abnormal findings on diagnostic imaging of central nervous system: Secondary | ICD-10-CM | POA: Diagnosis not present

## 2019-09-01 DIAGNOSIS — R519 Headache, unspecified: Secondary | ICD-10-CM | POA: Diagnosis not present

## 2019-09-01 DIAGNOSIS — F4489 Other dissociative and conversion disorders: Secondary | ICD-10-CM | POA: Diagnosis not present

## 2019-09-01 DIAGNOSIS — I6381 Other cerebral infarction due to occlusion or stenosis of small artery: Secondary | ICD-10-CM | POA: Diagnosis not present

## 2019-09-01 DIAGNOSIS — I4891 Unspecified atrial fibrillation: Secondary | ICD-10-CM | POA: Diagnosis not present

## 2019-09-01 DIAGNOSIS — I639 Cerebral infarction, unspecified: Secondary | ICD-10-CM | POA: Diagnosis not present

## 2019-09-01 DIAGNOSIS — I10 Essential (primary) hypertension: Secondary | ICD-10-CM | POA: Diagnosis not present

## 2019-09-01 DIAGNOSIS — E039 Hypothyroidism, unspecified: Secondary | ICD-10-CM | POA: Diagnosis not present

## 2019-09-01 DIAGNOSIS — R531 Weakness: Secondary | ICD-10-CM | POA: Diagnosis not present

## 2019-09-01 DIAGNOSIS — R4701 Aphasia: Secondary | ICD-10-CM | POA: Diagnosis not present

## 2019-09-02 DIAGNOSIS — Z833 Family history of diabetes mellitus: Secondary | ICD-10-CM | POA: Diagnosis not present

## 2019-09-02 DIAGNOSIS — F0150 Vascular dementia without behavioral disturbance: Secondary | ICD-10-CM | POA: Diagnosis present

## 2019-09-02 DIAGNOSIS — M069 Rheumatoid arthritis, unspecified: Secondary | ICD-10-CM | POA: Diagnosis present

## 2019-09-02 DIAGNOSIS — I63233 Cerebral infarction due to unspecified occlusion or stenosis of bilateral carotid arteries: Secondary | ICD-10-CM | POA: Diagnosis not present

## 2019-09-02 DIAGNOSIS — E1140 Type 2 diabetes mellitus with diabetic neuropathy, unspecified: Secondary | ICD-10-CM | POA: Diagnosis present

## 2019-09-02 DIAGNOSIS — E039 Hypothyroidism, unspecified: Secondary | ICD-10-CM | POA: Diagnosis present

## 2019-09-02 DIAGNOSIS — Z8673 Personal history of transient ischemic attack (TIA), and cerebral infarction without residual deficits: Secondary | ICD-10-CM | POA: Diagnosis not present

## 2019-09-02 DIAGNOSIS — I4891 Unspecified atrial fibrillation: Secondary | ICD-10-CM | POA: Diagnosis present

## 2019-09-02 DIAGNOSIS — R4189 Other symptoms and signs involving cognitive functions and awareness: Secondary | ICD-10-CM | POA: Diagnosis present

## 2019-09-02 DIAGNOSIS — E785 Hyperlipidemia, unspecified: Secondary | ICD-10-CM | POA: Diagnosis present

## 2019-09-02 DIAGNOSIS — M1990 Unspecified osteoarthritis, unspecified site: Secondary | ICD-10-CM | POA: Diagnosis present

## 2019-09-02 DIAGNOSIS — Z8249 Family history of ischemic heart disease and other diseases of the circulatory system: Secondary | ICD-10-CM | POA: Diagnosis not present

## 2019-09-02 DIAGNOSIS — R29702 NIHSS score 2: Secondary | ICD-10-CM | POA: Diagnosis present

## 2019-09-02 DIAGNOSIS — R262 Difficulty in walking, not elsewhere classified: Secondary | ICD-10-CM | POA: Diagnosis present

## 2019-09-02 DIAGNOSIS — Z7901 Long term (current) use of anticoagulants: Secondary | ICD-10-CM | POA: Diagnosis not present

## 2019-09-02 DIAGNOSIS — I6381 Other cerebral infarction due to occlusion or stenosis of small artery: Secondary | ICD-10-CM | POA: Diagnosis present

## 2019-09-02 DIAGNOSIS — I639 Cerebral infarction, unspecified: Secondary | ICD-10-CM | POA: Diagnosis not present

## 2019-09-02 DIAGNOSIS — Z809 Family history of malignant neoplasm, unspecified: Secondary | ICD-10-CM | POA: Diagnosis not present

## 2019-09-02 DIAGNOSIS — E119 Type 2 diabetes mellitus without complications: Secondary | ICD-10-CM | POA: Diagnosis not present

## 2019-09-02 DIAGNOSIS — Z96653 Presence of artificial knee joint, bilateral: Secondary | ICD-10-CM | POA: Diagnosis present

## 2019-09-02 DIAGNOSIS — Z885 Allergy status to narcotic agent status: Secondary | ICD-10-CM | POA: Diagnosis not present

## 2019-09-02 DIAGNOSIS — R32 Unspecified urinary incontinence: Secondary | ICD-10-CM | POA: Diagnosis present

## 2019-09-02 DIAGNOSIS — Z794 Long term (current) use of insulin: Secondary | ICD-10-CM | POA: Diagnosis not present

## 2019-09-02 DIAGNOSIS — I10 Essential (primary) hypertension: Secondary | ICD-10-CM | POA: Diagnosis present

## 2019-09-02 DIAGNOSIS — Z90710 Acquired absence of both cervix and uterus: Secondary | ICD-10-CM | POA: Diagnosis not present

## 2019-09-02 DIAGNOSIS — J45909 Unspecified asthma, uncomplicated: Secondary | ICD-10-CM | POA: Diagnosis present

## 2019-09-02 DIAGNOSIS — R4701 Aphasia: Secondary | ICD-10-CM | POA: Diagnosis present

## 2019-09-07 DIAGNOSIS — I639 Cerebral infarction, unspecified: Secondary | ICD-10-CM | POA: Diagnosis not present

## 2019-09-07 DIAGNOSIS — E119 Type 2 diabetes mellitus without complications: Secondary | ICD-10-CM | POA: Diagnosis not present

## 2019-09-07 DIAGNOSIS — F4489 Other dissociative and conversion disorders: Secondary | ICD-10-CM | POA: Diagnosis not present

## 2019-09-10 DIAGNOSIS — E1165 Type 2 diabetes mellitus with hyperglycemia: Secondary | ICD-10-CM | POA: Diagnosis not present

## 2019-09-10 DIAGNOSIS — M2041 Other hammer toe(s) (acquired), right foot: Secondary | ICD-10-CM | POA: Diagnosis not present

## 2019-09-10 DIAGNOSIS — L851 Acquired keratosis [keratoderma] palmaris et plantaris: Secondary | ICD-10-CM | POA: Diagnosis not present

## 2019-09-10 DIAGNOSIS — R2681 Unsteadiness on feet: Secondary | ICD-10-CM | POA: Diagnosis not present

## 2019-11-17 DIAGNOSIS — E039 Hypothyroidism, unspecified: Secondary | ICD-10-CM | POA: Diagnosis not present

## 2019-11-17 DIAGNOSIS — E119 Type 2 diabetes mellitus without complications: Secondary | ICD-10-CM | POA: Diagnosis not present

## 2019-11-17 DIAGNOSIS — R531 Weakness: Secondary | ICD-10-CM | POA: Diagnosis not present

## 2019-11-17 DIAGNOSIS — E7800 Pure hypercholesterolemia, unspecified: Secondary | ICD-10-CM | POA: Diagnosis not present

## 2019-11-17 DIAGNOSIS — I10 Essential (primary) hypertension: Secondary | ICD-10-CM | POA: Diagnosis not present

## 2019-11-17 DIAGNOSIS — E559 Vitamin D deficiency, unspecified: Secondary | ICD-10-CM | POA: Diagnosis not present

## 2019-11-17 DIAGNOSIS — Z130 Encounter for screening for diseases of the blood and blood-forming organs and certain disorders involving the immune mechanism: Secondary | ICD-10-CM | POA: Diagnosis not present

## 2019-11-17 DIAGNOSIS — D519 Vitamin B12 deficiency anemia, unspecified: Secondary | ICD-10-CM | POA: Diagnosis not present

## 2019-11-17 DIAGNOSIS — Z1321 Encounter for screening for nutritional disorder: Secondary | ICD-10-CM | POA: Diagnosis not present

## 2019-11-24 DIAGNOSIS — I10 Essential (primary) hypertension: Secondary | ICD-10-CM | POA: Diagnosis not present

## 2019-11-24 DIAGNOSIS — F0390 Unspecified dementia without behavioral disturbance: Secondary | ICD-10-CM | POA: Diagnosis not present

## 2019-11-24 DIAGNOSIS — I639 Cerebral infarction, unspecified: Secondary | ICD-10-CM | POA: Diagnosis not present

## 2019-11-24 DIAGNOSIS — E119 Type 2 diabetes mellitus without complications: Secondary | ICD-10-CM | POA: Diagnosis not present

## 2019-11-24 DIAGNOSIS — F419 Anxiety disorder, unspecified: Secondary | ICD-10-CM | POA: Diagnosis not present

## 2019-11-24 DIAGNOSIS — E7800 Pure hypercholesterolemia, unspecified: Secondary | ICD-10-CM | POA: Diagnosis not present

## 2019-11-29 DIAGNOSIS — R413 Other amnesia: Secondary | ICD-10-CM | POA: Diagnosis not present

## 2019-11-29 DIAGNOSIS — F4489 Other dissociative and conversion disorders: Secondary | ICD-10-CM | POA: Diagnosis not present

## 2019-12-13 DIAGNOSIS — F4489 Other dissociative and conversion disorders: Secondary | ICD-10-CM | POA: Diagnosis not present

## 2019-12-13 DIAGNOSIS — R413 Other amnesia: Secondary | ICD-10-CM | POA: Diagnosis not present

## 2019-12-13 DIAGNOSIS — F0151 Vascular dementia with behavioral disturbance: Secondary | ICD-10-CM | POA: Diagnosis not present

## 2020-03-02 DIAGNOSIS — J019 Acute sinusitis, unspecified: Secondary | ICD-10-CM | POA: Diagnosis not present

## 2020-03-02 DIAGNOSIS — Z1331 Encounter for screening for depression: Secondary | ICD-10-CM | POA: Diagnosis not present

## 2020-03-02 DIAGNOSIS — E119 Type 2 diabetes mellitus without complications: Secondary | ICD-10-CM | POA: Diagnosis not present

## 2020-03-02 DIAGNOSIS — R05 Cough: Secondary | ICD-10-CM | POA: Diagnosis not present

## 2020-04-26 DIAGNOSIS — F0390 Unspecified dementia without behavioral disturbance: Secondary | ICD-10-CM | POA: Diagnosis not present

## 2020-04-26 DIAGNOSIS — R079 Chest pain, unspecified: Secondary | ICD-10-CM | POA: Diagnosis not present

## 2020-04-26 DIAGNOSIS — E039 Hypothyroidism, unspecified: Secondary | ICD-10-CM | POA: Diagnosis not present

## 2020-04-26 DIAGNOSIS — Z7901 Long term (current) use of anticoagulants: Secondary | ICD-10-CM | POA: Diagnosis not present

## 2020-04-26 DIAGNOSIS — E119 Type 2 diabetes mellitus without complications: Secondary | ICD-10-CM | POA: Diagnosis not present

## 2020-04-26 DIAGNOSIS — Z7984 Long term (current) use of oral hypoglycemic drugs: Secondary | ICD-10-CM | POA: Diagnosis not present

## 2020-04-26 DIAGNOSIS — R1013 Epigastric pain: Secondary | ICD-10-CM | POA: Diagnosis not present

## 2020-04-26 DIAGNOSIS — Z8673 Personal history of transient ischemic attack (TIA), and cerebral infarction without residual deficits: Secondary | ICD-10-CM | POA: Diagnosis not present

## 2020-04-26 DIAGNOSIS — E7800 Pure hypercholesterolemia, unspecified: Secondary | ICD-10-CM | POA: Diagnosis not present

## 2020-04-26 DIAGNOSIS — I10 Essential (primary) hypertension: Secondary | ICD-10-CM | POA: Diagnosis not present

## 2020-04-26 DIAGNOSIS — Z79899 Other long term (current) drug therapy: Secondary | ICD-10-CM | POA: Diagnosis not present

## 2020-05-17 DIAGNOSIS — R350 Frequency of micturition: Secondary | ICD-10-CM | POA: Diagnosis not present

## 2020-05-23 DIAGNOSIS — R519 Headache, unspecified: Secondary | ICD-10-CM | POA: Diagnosis not present

## 2020-05-23 DIAGNOSIS — M25562 Pain in left knee: Secondary | ICD-10-CM | POA: Diagnosis not present

## 2020-05-23 DIAGNOSIS — E039 Hypothyroidism, unspecified: Secondary | ICD-10-CM | POA: Diagnosis not present

## 2020-05-23 DIAGNOSIS — Z7901 Long term (current) use of anticoagulants: Secondary | ICD-10-CM | POA: Diagnosis not present

## 2020-05-23 DIAGNOSIS — S3993XA Unspecified injury of pelvis, initial encounter: Secondary | ICD-10-CM | POA: Diagnosis not present

## 2020-05-23 DIAGNOSIS — Z96651 Presence of right artificial knee joint: Secondary | ICD-10-CM | POA: Diagnosis not present

## 2020-05-23 DIAGNOSIS — Z96652 Presence of left artificial knee joint: Secondary | ICD-10-CM | POA: Diagnosis not present

## 2020-05-23 DIAGNOSIS — S199XXA Unspecified injury of neck, initial encounter: Secondary | ICD-10-CM | POA: Diagnosis not present

## 2020-05-23 DIAGNOSIS — S3991XA Unspecified injury of abdomen, initial encounter: Secondary | ICD-10-CM | POA: Diagnosis not present

## 2020-05-23 DIAGNOSIS — F0390 Unspecified dementia without behavioral disturbance: Secondary | ICD-10-CM | POA: Diagnosis not present

## 2020-05-23 DIAGNOSIS — R296 Repeated falls: Secondary | ICD-10-CM | POA: Diagnosis not present

## 2020-05-23 DIAGNOSIS — S299XXA Unspecified injury of thorax, initial encounter: Secondary | ICD-10-CM | POA: Diagnosis not present

## 2020-05-23 DIAGNOSIS — M25561 Pain in right knee: Secondary | ICD-10-CM | POA: Diagnosis not present

## 2020-05-23 DIAGNOSIS — Z794 Long term (current) use of insulin: Secondary | ICD-10-CM | POA: Diagnosis not present

## 2020-05-23 DIAGNOSIS — E119 Type 2 diabetes mellitus without complications: Secondary | ICD-10-CM | POA: Diagnosis not present

## 2020-05-23 DIAGNOSIS — R531 Weakness: Secondary | ICD-10-CM | POA: Diagnosis not present

## 2020-05-23 DIAGNOSIS — M542 Cervicalgia: Secondary | ICD-10-CM | POA: Diagnosis not present

## 2020-05-23 DIAGNOSIS — S0990XA Unspecified injury of head, initial encounter: Secondary | ICD-10-CM | POA: Diagnosis not present

## 2020-05-23 DIAGNOSIS — Z79899 Other long term (current) drug therapy: Secondary | ICD-10-CM | POA: Diagnosis not present

## 2020-05-23 DIAGNOSIS — E7800 Pure hypercholesterolemia, unspecified: Secondary | ICD-10-CM | POA: Diagnosis not present

## 2020-05-23 DIAGNOSIS — Z7984 Long term (current) use of oral hypoglycemic drugs: Secondary | ICD-10-CM | POA: Diagnosis not present

## 2020-05-23 DIAGNOSIS — Z8673 Personal history of transient ischemic attack (TIA), and cerebral infarction without residual deficits: Secondary | ICD-10-CM | POA: Diagnosis not present

## 2020-05-23 DIAGNOSIS — I10 Essential (primary) hypertension: Secondary | ICD-10-CM | POA: Diagnosis not present

## 2020-06-08 DIAGNOSIS — E7800 Pure hypercholesterolemia, unspecified: Secondary | ICD-10-CM | POA: Diagnosis not present

## 2020-06-08 DIAGNOSIS — E119 Type 2 diabetes mellitus without complications: Secondary | ICD-10-CM | POA: Diagnosis not present

## 2020-06-08 DIAGNOSIS — G20 Parkinson's disease: Secondary | ICD-10-CM | POA: Diagnosis not present

## 2020-06-08 DIAGNOSIS — E039 Hypothyroidism, unspecified: Secondary | ICD-10-CM | POA: Diagnosis not present

## 2020-06-08 DIAGNOSIS — Z7901 Long term (current) use of anticoagulants: Secondary | ICD-10-CM | POA: Diagnosis not present

## 2020-06-08 DIAGNOSIS — Z1331 Encounter for screening for depression: Secondary | ICD-10-CM | POA: Diagnosis not present

## 2020-06-08 DIAGNOSIS — I10 Essential (primary) hypertension: Secondary | ICD-10-CM | POA: Diagnosis not present

## 2020-07-04 DIAGNOSIS — R531 Weakness: Secondary | ICD-10-CM | POA: Diagnosis not present

## 2021-04-07 DEATH — deceased
